# Patient Record
Sex: Female | Born: 1965
Health system: Southern US, Community
[De-identification: ages and names within clinical notes are randomized; demographics above are authoritative.]

## PROBLEM LIST (undated history)

## (undated) DIAGNOSIS — I493 Ventricular premature depolarization: Secondary | ICD-10-CM

## (undated) DIAGNOSIS — D649 Anemia, unspecified: Secondary | ICD-10-CM

## (undated) DIAGNOSIS — K219 Gastro-esophageal reflux disease without esophagitis: Secondary | ICD-10-CM

## (undated) DIAGNOSIS — R519 Headache, unspecified: Secondary | ICD-10-CM

## (undated) DIAGNOSIS — M549 Dorsalgia, unspecified: Secondary | ICD-10-CM

## (undated) DIAGNOSIS — F419 Anxiety disorder, unspecified: Secondary | ICD-10-CM

## (undated) DIAGNOSIS — R51 Headache: Secondary | ICD-10-CM

## (undated) DIAGNOSIS — I471 Supraventricular tachycardia, unspecified: Secondary | ICD-10-CM

## (undated) DIAGNOSIS — E039 Hypothyroidism, unspecified: Secondary | ICD-10-CM

## (undated) DIAGNOSIS — D759 Disease of blood and blood-forming organs, unspecified: Secondary | ICD-10-CM

## (undated) DIAGNOSIS — K5190 Ulcerative colitis, unspecified, without complications: Secondary | ICD-10-CM

## (undated) HISTORY — PX: UPPER GI ENDOSCOPY: SHX6162

## (undated) HISTORY — PX: BACK SURGERY: SHX140

## (undated) HISTORY — PX: COLONOSCOPY: SHX174

## (undated) HISTORY — PX: DILATION AND CURETTAGE OF UTERUS: SHX78

## (undated) HISTORY — PX: RECONSTRUCTION OF NOSE: SHX2301

---

## 2017-01-10 ENCOUNTER — Encounter (HOSPITAL_COMMUNITY): Payer: Self-pay

## 2017-01-10 DIAGNOSIS — N939 Abnormal uterine and vaginal bleeding, unspecified: Secondary | ICD-10-CM | POA: Diagnosis present

## 2017-01-10 DIAGNOSIS — Z79899 Other long term (current) drug therapy: Secondary | ICD-10-CM | POA: Diagnosis not present

## 2017-01-10 NOTE — ED Triage Notes (Signed)
Pt complaining of vaginal bleeding x 8 days. Pt states passing clots. Pt states hx of blood transfusions. Pt states 1 pad/hr.

## 2017-01-11 ENCOUNTER — Emergency Department (HOSPITAL_COMMUNITY)
Admission: EM | Admit: 2017-01-11 | Discharge: 2017-01-11 | Disposition: A | Discharge status: Home / Self Care | Payer: BLUE CROSS/BLUE SHIELD | Attending: Emergency Medicine | Admitting: Emergency Medicine

## 2017-01-11 DIAGNOSIS — N939 Abnormal uterine and vaginal bleeding, unspecified: Secondary | ICD-10-CM

## 2017-01-11 LAB — I-STAT BETA HCG BLOOD, ED (MC, WL, AP ONLY): I-stat hCG, quantitative: <5 m[IU]/mL (ref <5)

## 2017-01-11 LAB — CBC
HEMATOCRIT: 39.2 % (ref 36.0–46.0)
Hemoglobin: 13 g/dL (ref 12.0–15.0)
MCH: 30.4 pg (ref 26.0–34.0)
MCHC: 33.2 g/dL (ref 30.0–36.0)
MCV: 91.6 fL (ref 78.0–100.0)
PLATELETS: 154 10*3/uL (ref 150–400)
RBC: 4.28 MIL/uL (ref 3.87–5.11)
RDW: 13.3 % (ref 11.5–15.5)
WBC: 8.4 10*3/uL (ref 4.0–10.5)

## 2017-01-11 MED ORDER — MEGESTROL ACETATE 20 MG PO TABS: 20.0000 mg | ORAL_TABLET | Freq: Two times a day (BID) | ORAL | 0 refills | Status: DC

## 2017-01-11 NOTE — ED Notes (Signed)
istat HCG resulted at 0101,  Test NEGATIVE and results [facility]

## 2017-01-11 NOTE — ED Notes (Signed)
Pt stable, ambulatory, states understanding of discharge instructions 

## 2017-01-11 NOTE — ED Provider Notes (Signed)
[facility]-EMERGENCY DEPT Provider Note   CSN: [id number] Arrival date & time: 01/10/17  2156  By signing my name below, I, Alexandra Lee, attest that this documentation has been prepared under the direction and in the presence of Alexandra Rhine, MD. Electronically Signed: Elder Lee, Scribe. 01/11/17. 12:31 AM.   History   Chief Complaint Chief Complaint  Patient presents with  . Vaginal Bleeding    HPI Alexandra Lee is a 51 y.o. female with history of blood loss anemia who presents to the ED for evaluation of vaginal bleeding. This patient states that around 9 days ago she initially developed vaginal bleeding associated with her normal menses. However her bleeding has continued at that severity everyday since that time. She presents today concerned for blood loss. She denies any injury. Denies any abdominal pain or vaginal pain. She is not anticoagulated. No fever, vomiting, or syncope.  The history is provided by the patient. No language interpreter was used.  Vaginal Bleeding  Primary symptoms include vaginal bleeding. There has been no fever. This is a new problem. Episode onset: 9 days. The problem occurs hourly. The problem has not changed since onset.The symptoms occur at rest. She is not pregnant. She has not missed her period. Pertinent negatives include no abdominal pain and no vomiting.    PMH - none  OB History    No data available       Home Medications    Prior to Admission medications   Medication Sig Start Date End Date Taking? Authorizing Provider  ferrous sulfate 325 (65 FE) MG tablet Take 325 mg by mouth daily with breakfast.   Yes [provider]  LORazepam (ATIVAN) 1 MG tablet Take 1 mg by mouth at bedtime.   Yes [provider]  mesalamine (CANASA) 1000 MG suppository Place 1,000 mg rectally at bedtime.   Yes [provider]  mesalamine (PENTASA) 500 MG CR capsule Take 1,000 mg by mouth 3 (three) times daily.   Yes  [provider]    Family History No family history on file.  Social History Social History  Substance Use Topics  . Smoking status: Never Smoker  . Smokeless tobacco: Never Used  . Alcohol use No     Allergies   Patient has no known allergies.   Review of Systems Review of Systems  Constitutional: Negative for fever.  Gastrointestinal: Negative for abdominal pain and vomiting.  Genitourinary: Positive for vaginal bleeding. Negative for vaginal pain.  Neurological: Negative for syncope.  All other systems reviewed and are negative.    Physical Exam Updated Vital Signs BP 128/73 (BP Location: Left Arm)   Pulse 91   Temp 98.3 F (36.8 C) (Oral)   Resp 18   LMP 01/10/2017 (Exact Date)   SpO2 100%   Physical Exam CONSTITUTIONAL: Well developed/well nourished HEAD: Normocephalic/atraumatic EYES: EOMI/PERRL ENMT: Mucous membranes moist NECK: supple no meningeal signs SPINE/BACK:entire spine nontender CV: S1/S2 noted, no murmurs/rubs/gallops noted LUNGS: Lungs are clear to auscultation bilaterally, no apparent distress ABDOMEN: soft, nontender, no rebound or guarding, bowel sounds noted throughout abdomen GU:no cva tenderness  , os closed, no active bleeding, blood noted in vaginal vault. Nurse Cricket chaperoned exam NEURO: Pt is awake/alert/appropriate, moves all extremitiesx4.  No facial droop.   EXTREMITIES: pulses normal/equal, full ROM SKIN: warm, color normal PSYCH: no abnormalities of mood noted, alert and oriented to situation   ED Treatments / Results  Labs (all labs ordered are listed, but only abnormal results are displayed) Labs  Reviewed  CBC  I-STAT BETA HCG BLOOD, ED ([facility], [facility], [facility] ONLY)    EKG  EKG Interpretation None       Radiology No results found.  Procedures Procedures (including critical care time)  Medications Ordered in ED Medications - No data to display   Initial Impression / Assessment and Plan / ED Course  I  have reviewed the triage vital signs and the nursing notes.  Pertinent labs results that were available during my care of the patient were reviewed by me and considered in my medical decision making (see chart for details).     Labs reassuring She is not pregnant Pt well appearing  [facility] Heather via phone If patient has significant bleeding or is uncomfortable Megace 20BID can be started Will need close GYN followup  Pt will take the Rx for megace, and if bleeding continues for another 24 hrs she will start meds Multiple referral numbers given to patient to establish Pt appropriate for d/c home    Final Clinical Impressions(s) / ED Diagnoses   Final diagnoses:  Vaginal bleeding    New Prescriptions Discharge Medication List as of 01/11/2017  2:08 AM    START taking these medications   Details  megestrol (MEGACE) 20 MG tablet Take 1 tablet (20 mg total) by mouth 2 (two) times daily., Starting Mon 01/11/2017, Print      I personally performed the services described in this documentation, which was scribed in my presence. The recorded information has been reviewed and is accurate.       Alexandra RhineWickline, Milo Solana, MD 01/11/17 [phone]

## 2017-01-11 NOTE — ED Notes (Signed)
Spoke w/ Alexandra BraunKaren, phlebotomy tech, to inquired on pt HCG. She stated she ran "HCG and it resulted already. It might not be crossing over."

## 2017-02-23 ENCOUNTER — Other Ambulatory Visit: Payer: Self-pay | Admitting: Obstetrics and Gynecology

## 2017-02-23 ENCOUNTER — Encounter (HOSPITAL_COMMUNITY): Payer: Self-pay | Admitting: *Deleted

## 2017-02-24 ENCOUNTER — Encounter (HOSPITAL_COMMUNITY): Payer: Self-pay | Admitting: *Deleted

## 2017-02-26 ENCOUNTER — Encounter (HOSPITAL_COMMUNITY)
Admission: RE | Disposition: A | Discharge status: Home / Self Care | Payer: Self-pay | Source: Ambulatory Visit | Attending: Obstetrics and Gynecology

## 2017-02-26 ENCOUNTER — Ambulatory Visit (HOSPITAL_COMMUNITY)
Admission: RE | Admit: 2017-02-26 | Discharge: 2017-02-26 | Disposition: A | Discharge status: Home / Self Care | Payer: BLUE CROSS/BLUE SHIELD | Source: Ambulatory Visit | Attending: Obstetrics and Gynecology | Admitting: Obstetrics and Gynecology

## 2017-02-26 ENCOUNTER — Ambulatory Visit (HOSPITAL_COMMUNITY): Payer: BLUE CROSS/BLUE SHIELD | Admitting: Anesthesiology

## 2017-02-26 ENCOUNTER — Encounter (HOSPITAL_COMMUNITY): Payer: Self-pay | Admitting: Anesthesiology

## 2017-02-26 DIAGNOSIS — Z91040 Latex allergy status: Secondary | ICD-10-CM | POA: Diagnosis not present

## 2017-02-26 DIAGNOSIS — E039 Hypothyroidism, unspecified: Secondary | ICD-10-CM | POA: Insufficient documentation

## 2017-02-26 DIAGNOSIS — F419 Anxiety disorder, unspecified: Secondary | ICD-10-CM | POA: Insufficient documentation

## 2017-02-26 DIAGNOSIS — N939 Abnormal uterine and vaginal bleeding, unspecified: Secondary | ICD-10-CM | POA: Diagnosis present

## 2017-02-26 DIAGNOSIS — K219 Gastro-esophageal reflux disease without esophagitis: Secondary | ICD-10-CM | POA: Diagnosis not present

## 2017-02-26 DIAGNOSIS — Z886 Allergy status to analgesic agent status: Secondary | ICD-10-CM | POA: Insufficient documentation

## 2017-02-26 DIAGNOSIS — N840 Polyp of corpus uteri: Secondary | ICD-10-CM | POA: Insufficient documentation

## 2017-02-26 HISTORY — DX: Hypothyroidism, unspecified: E03.9

## 2017-02-26 HISTORY — DX: Ulcerative colitis, unspecified, without complications: K51.90

## 2017-02-26 HISTORY — DX: Anemia, unspecified: D64.9

## 2017-02-26 HISTORY — DX: Headache: R51

## 2017-02-26 HISTORY — PX: DILATATION & CURETTAGE/HYSTEROSCOPY WITH MYOSURE: SHX6511

## 2017-02-26 HISTORY — DX: Gastro-esophageal reflux disease without esophagitis: K21.9

## 2017-02-26 HISTORY — DX: Disease of blood and blood-forming organs, unspecified: D75.9

## 2017-02-26 HISTORY — DX: Ventricular premature depolarization: I49.3

## 2017-02-26 HISTORY — DX: Anxiety disorder, unspecified: F41.9

## 2017-02-26 HISTORY — DX: Headache, unspecified: R51.9

## 2017-02-26 LAB — BASIC METABOLIC PANEL
ANION GAP: 10 (ref 5–15)
BUN: 14 mg/dL (ref 6–20)
CHLORIDE: 103 mmol/L (ref 101–111)
CO2: 23 mmol/L (ref 22–32)
Calcium: 9.2 mg/dL (ref 8.9–10.3)
Creatinine, Ser: 0.64 mg/dL (ref 0.44–1.00)
GFR calc non Af Amer: >60 mL/min (ref >60)
Glucose, Bld: 100 mg/dL — ABNORMAL HIGH (ref 65–99)
POTASSIUM: 3.5 mmol/L (ref 3.5–5.1)
Sodium: 136 mmol/L (ref 135–145)

## 2017-02-26 LAB — CBC
HEMATOCRIT: 41.3 % (ref 36.0–46.0)
HEMOGLOBIN: 13.3 g/dL (ref 12.0–15.0)
MCH: 30.2 pg (ref 26.0–34.0)
MCHC: 32.2 g/dL (ref 30.0–36.0)
MCV: 93.7 fL (ref 78.0–100.0)
Platelets: 361 10*3/uL (ref 150–400)
RBC: 4.41 MIL/uL (ref 3.87–5.11)
RDW: 14 % (ref 11.5–15.5)
WBC: 8 10*3/uL (ref 4.0–10.5)

## 2017-02-26 SURGERY — DILATATION & CURETTAGE/HYSTEROSCOPY WITH MYOSURE
Anesthesia: General | Site: Vagina

## 2017-02-26 MED ORDER — SODIUM CHLORIDE 0.9 % IJ SOLN
INTRAMUSCULAR | Status: DC | PRN
  Administered 2017-02-26: 20 mL

## 2017-02-26 MED ORDER — ONDANSETRON HCL 4 MG/2ML IJ SOLN: 4.0000 mg | Freq: Once | INTRAMUSCULAR | Status: DC | PRN

## 2017-02-26 MED ORDER — ACETAMINOPHEN 160 MG/5ML PO SOLN: 325.0000 mg | ORAL | Status: DC | PRN

## 2017-02-26 MED ORDER — SCOPOLAMINE 1 MG/3DAYS TD PT72
1.0000 | MEDICATED_PATCH | Freq: Once | TRANSDERMAL | Status: DC
  Administered 2017-02-26: 1.5 mg via TRANSDERMAL

## 2017-02-26 MED ORDER — ACETAMINOPHEN 325 MG PO TABS: 325.0000 mg | ORAL_TABLET | ORAL | Status: DC | PRN

## 2017-02-26 MED ORDER — MIDAZOLAM HCL 2 MG/2ML IJ SOLN
INTRAMUSCULAR | Status: AC
Start: 2017-02-26 — End: ?
  Filled 2017-02-26: qty 2

## 2017-02-26 MED ORDER — LACTATED RINGERS IV SOLN
INTRAVENOUS | Status: DC
  Administered 2017-02-26: 08:00:00 via INTRAVENOUS

## 2017-02-26 MED ORDER — TRAMADOL HCL 50 MG PO TABS: 50.0000 mg | ORAL_TABLET | Freq: Four times a day (QID) | ORAL | 0 refills | Status: DC | PRN

## 2017-02-26 MED ORDER — DEXAMETHASONE SODIUM PHOSPHATE 10 MG/ML IJ SOLN
INTRAMUSCULAR | Status: DC | PRN
  Administered 2017-02-26: 10 mg via INTRAVENOUS

## 2017-02-26 MED ORDER — PROPOFOL 10 MG/ML IV BOLUS
INTRAVENOUS | Status: DC | PRN
  Administered 2017-02-26: 150 mg via INTRAVENOUS

## 2017-02-26 MED ORDER — FENTANYL CITRATE (PF) 100 MCG/2ML IJ SOLN
INTRAMUSCULAR | Status: DC | PRN
  Administered 2017-02-26 (×2): 50 ug via INTRAVENOUS

## 2017-02-26 MED ORDER — SCOPOLAMINE 1 MG/3DAYS TD PT72
MEDICATED_PATCH | TRANSDERMAL | Status: AC
  Administered 2017-02-26: 1.5 mg via TRANSDERMAL
  Filled 2017-02-26: qty 1

## 2017-02-26 MED ORDER — ONDANSETRON HCL 4 MG/2ML IJ SOLN
INTRAMUSCULAR | Status: DC | PRN
  Administered 2017-02-26: 4 mg via INTRAVENOUS

## 2017-02-26 MED ORDER — ONDANSETRON HCL 4 MG/2ML IJ SOLN
INTRAMUSCULAR | Status: AC
  Filled 2017-02-26: qty 2

## 2017-02-26 MED ORDER — OXYCODONE HCL 5 MG PO TABS: 5.0000 mg | ORAL_TABLET | Freq: Once | ORAL | Status: DC | PRN

## 2017-02-26 MED ORDER — OXYCODONE HCL 5 MG/5ML PO SOLN: 5.0000 mg | Freq: Once | ORAL | Status: DC | PRN

## 2017-02-26 MED ORDER — BUPIVACAINE HCL (PF) 0.25 % IJ SOLN
INTRAMUSCULAR | Status: AC
  Filled 2017-02-26: qty 30

## 2017-02-26 MED ORDER — KETOROLAC TROMETHAMINE 30 MG/ML IJ SOLN
INTRAMUSCULAR | Status: DC | PRN
  Administered 2017-02-26: 30 mg via INTRAVENOUS

## 2017-02-26 MED ORDER — DEXAMETHASONE SODIUM PHOSPHATE 10 MG/ML IJ SOLN
INTRAMUSCULAR | Status: AC
  Filled 2017-02-26: qty 1

## 2017-02-26 MED ORDER — FENTANYL CITRATE (PF) 100 MCG/2ML IJ SOLN
INTRAMUSCULAR | Status: AC
  Filled 2017-02-26: qty 2

## 2017-02-26 MED ORDER — VASOPRESSIN 20 UNIT/ML IV SOLN
INTRAVENOUS | Status: DC | PRN
  Administered 2017-02-26: 20 [IU]

## 2017-02-26 MED ORDER — CEFAZOLIN SODIUM-DEXTROSE 2-4 GM/100ML-% IV SOLN
2.0000 g | INTRAVENOUS | Status: AC
  Administered 2017-02-26: 2 g via INTRAVENOUS

## 2017-02-26 MED ORDER — FENTANYL CITRATE (PF) 100 MCG/2ML IJ SOLN: 25.0000 ug | INTRAMUSCULAR | Status: DC | PRN

## 2017-02-26 MED ORDER — LIDOCAINE HCL (CARDIAC) 20 MG/ML IV SOLN
INTRAVENOUS | Status: AC
  Filled 2017-02-26: qty 5

## 2017-02-26 MED ORDER — MIDAZOLAM HCL 5 MG/5ML IJ SOLN
INTRAMUSCULAR | Status: DC | PRN
  Administered 2017-02-26: 2 mg via INTRAVENOUS

## 2017-02-26 MED ORDER — PROPOFOL 10 MG/ML IV BOLUS
INTRAVENOUS | Status: AC
  Filled 2017-02-26: qty 20

## 2017-02-26 MED ORDER — LIDOCAINE HCL (CARDIAC) 20 MG/ML IV SOLN
INTRAVENOUS | Status: DC | PRN
  Administered 2017-02-26: 50 mg via INTRAVENOUS

## 2017-02-26 MED ORDER — MEPERIDINE HCL 25 MG/ML IJ SOLN: 6.2500 mg | INTRAMUSCULAR | Status: DC | PRN

## 2017-02-26 MED ORDER — BUPIVACAINE HCL (PF) 0.25 % IJ SOLN
INTRAMUSCULAR | Status: DC | PRN
  Administered 2017-02-26: 20 mL

## 2017-02-26 SURGICAL SUPPLY — 21 items
CANISTER SUCT 3000ML PPV (MISCELLANEOUS) ×2 IMPLANT
CATH ROBINSON RED A/P 16FR (CATHETERS) ×2 IMPLANT
CLOTH BEACON ORANGE TIMEOUT ST (SAFETY) ×2 IMPLANT
CONTAINER PREFILL 10% NBF 60ML (FORM) ×4 IMPLANT
DECANTER SPIKE VIAL GLASS SM (MISCELLANEOUS) ×4 IMPLANT
DEVICE MYOSURE LITE (MISCELLANEOUS) IMPLANT
DEVICE MYOSURE REACH (MISCELLANEOUS) ×2 IMPLANT
FILTER ARTHROSCOPY CONVERTOR (FILTER) ×2 IMPLANT
GLOVE BIO SURGEON STRL SZ7.5 (GLOVE) ×2 IMPLANT
GLOVE BIOGEL PI IND STRL 7.0 (GLOVE) ×1 IMPLANT
GLOVE BIOGEL PI INDICATOR 7.0 (GLOVE) ×1
GOWN STRL REUS W/TWL LRG LVL3 (GOWN DISPOSABLE) ×4 IMPLANT
NEEDLE SPNL 22GX3.5 QUINCKE BK (NEEDLE) ×4 IMPLANT
PACK VAGINAL MINOR WOMEN LF (CUSTOM PROCEDURE TRAY) ×2 IMPLANT
PAD OB MATERNITY 4.3X12.25 (PERSONAL CARE ITEMS) ×2 IMPLANT
SEAL ROD LENS SCOPE MYOSURE (ABLATOR) ×2 IMPLANT
SYR CONTROL 10ML LL (SYRINGE) ×4 IMPLANT
SYR TB 1ML 25GX5/8 (SYRINGE) ×2 IMPLANT
TOWEL OR 17X24 6PK STRL BLUE (TOWEL DISPOSABLE) ×4 IMPLANT
TUBING AQUILEX INFLOW (TUBING) ×2 IMPLANT
TUBING AQUILEX OUTFLOW (TUBING) ×2 IMPLANT

## 2017-02-26 NOTE — Anesthesia Preprocedure Evaluation (Signed)
Anesthesia Evaluation  Patient identified by MRN, date of birth, ID band Patient awake    Reviewed: Allergy & Precautions, H&P , NPO status , Patient's Chart, lab work & pertinent test results  Airway Mallampati: I  TM Distance: >3 FB Neck ROM: full    Dental no notable dental hx. (+) Teeth Intact   Pulmonary neg pulmonary ROS,    breath sounds clear to auscultation       Cardiovascular negative cardio ROS Normal cardiovascular exam Rhythm:regular Rate:Normal     Neuro/Psych PSYCHIATRIC DISORDERS Anxiety    GI/Hepatic Neg liver ROS,   Endo/Other  Hypothyroidism   Renal/GU negative Renal ROS     Musculoskeletal   Abdominal Normal abdominal exam  (+)   Peds  Hematology   Anesthesia Other Findings   Reproductive/Obstetrics negative OB ROS                             Anesthesia Physical Anesthesia Plan  ASA: II  Anesthesia Plan: General   Post-op Pain Management:    Induction: Intravenous  PONV Risk Score and Plan: 4 or greater and Ondansetron, Dexamethasone, Propofol, Midazolam and Scopolamine patch - Pre-op  Airway Management Planned: LMA  Additional Equipment:   Intra-op Plan:   Post-operative Plan:   Informed Consent: I have reviewed the patients History and Physical, chart, labs and discussed the procedure including the risks, benefits and alternatives for the proposed anesthesia with the patient or authorized representative who has indicated his/her understanding and acceptance.   Dental Advisory Given  Plan Discussed with: CRNA and Surgeon  Anesthesia Plan Comments:         Anesthesia Quick Evaluation

## 2017-02-26 NOTE — H&P (Signed)
Alexandra Lee is an 51 y.o. female. AUB with secondary anemia  Pertinent Gynecological History: Menses: flow is moderate Bleeding: intermenstrual bleeding Contraception: tubal ligation DES exposure: denies Blood transfusions: none Sexually transmitted diseases: no past history Previous GYN Procedures: DNC  Last mammogram: normal Date: 2018 Last pap: normal Date: 2018 OB History: G2, P2   Menstrual History: Menarche age: [age] No LMP recorded. Patient is premenopausal.    Past Medical History:  Diagnosis Date  . Anemia   . Anxiety   . Blood dyscrasia   . GERD (gastroesophageal reflux disease)    tums after certain foods  . Headache   . Hypothyroidism   . PVC (premature ventricular contraction) no meds    increase pulse occasionally   . UC (ulcerative colitis) [facility](HCC)     Past Surgical History:  Procedure Laterality Date  . CESAREAN SECTION     x 2  . DILATION AND CURETTAGE OF UTERUS      History reviewed. No pertinent family history.  Social History:  reports that she has never smoked. She has never used smokeless tobacco. She reports that she does not drink alcohol or use drugs.  Allergies:  Allergies  Allergen Reactions  . Doxycycline Diarrhea and Nausea And Vomiting  . Ibuprofen Other (See Comments)    interacts with ulcerative colitis medications.  . Latex Other (See Comments)    Skin reaction with prolonged contact (i.e. Bandages/sutures)    No prescriptions prior to admission.    Review of Systems  Constitutional: Negative.   All other systems reviewed and are negative.   Height 5\' 5"  (1.651 m), weight 68 kg (150 lb). Physical Exam  Nursing note and vitals reviewed. Constitutional: She is oriented to person, place, and time. She appears well-developed and well-nourished.  HENT:  Head: Atraumatic.  Neck: Normal range of motion. Neck supple.  Cardiovascular: Normal rate and regular rhythm.   Respiratory: Effort normal and breath sounds normal.   GI: Soft. Bowel sounds are normal.  Genitourinary: Vagina normal and uterus normal.  Musculoskeletal: Normal range of motion.  Neurological: She is alert and oriented to person, place, and time. She has normal reflexes.  Skin: Skin is warm and dry.  Psychiatric: She has a normal mood and affect.    No results found for this or any previous visit (from the past 24 hour(s)).  No results found.  Assessment/Plan: AUB with large polpoid lesion DIag HS, D&C with myosure Risks vs benefits discussed. Consent done.  Adin Lariccia J 02/26/2017, 6:19 AM

## 2017-02-26 NOTE — Progress Notes (Signed)
Patient seen and examined. Consent witnessed and signed. No changes noted. Update completed.Patient ID: Alexandra ForthStephanie Lee, female   DOB: 10-24-1965, 51 y.o.   MRN: [id number]

## 2017-02-26 NOTE — Transfer of Care (Signed)
Immediate Anesthesia Transfer of Care Note  Patient: Alexandra Lee  Procedure(s) Performed: Procedure(s): DILATATION & CURETTAGE/HYSTEROSCOPY WITH MYOSURE (N/A)  Patient Location: PACU  Anesthesia Type:General  Level of Consciousness: awake  Airway & Oxygen Therapy: Patient Spontanous Breathing and Patient connected to nasal cannula oxygen  Post-op Assessment: Report given to RN and Post -op Vital signs reviewed and stable  Post vital signs: stable  Last Vitals:  Vitals:   02/26/17 0804  BP: 114/75  Pulse: 99  Resp: 18  Temp: 37.5 C    Last Pain:  Vitals:   02/26/17 0804  TempSrc: Oral  PainSc: 3       Patients Stated Pain Goal: 3 (02/26/17 0804)  Complications: No apparent anesthesia complications

## 2017-02-26 NOTE — Op Note (Signed)
NAMReeves Forth:  Alexandra Lee, Alexandra Lee              ACCOUNT NO.:  [id number]  MEDICAL RECORD NO.:  [id number]  LOCATION:                                 FACILITY:  PHYSICIAN:  Lenoard Adenichard J. Ulmer Degen, M.D.DATE OF BIRTH:  Aug 17, 1966  DATE OF PROCEDURE: DATE OF DISCHARGE:                              OPERATIVE REPORT   PREOPERATIVE DIAGNOSIS:  Abnormal uterine bleeding with large endometrial polyp.  POSTOPERATIVE DIAGNOSIS:  Abnormal uterine bleeding with large endometrial polyp.  PROCEDURE:  Diagnostic hysteroscopy, D and C, MyoSure resection of large endometrial polyp.  SURGEON:  Lenoard Adenichard J. Belissa Kooy, M.D.  ASSISTANT:  None.  ANESTHESIA:  General and local.  ESTIMATED BLOOD LOSS:  Less than 50 mL.  FLUID DEFICIT:  400 mL.  COMPLICATIONS:  None.  DRAINS:  None.  COUNTS:  Correct.  DISPOSITION:  The patient to Recovery in good condition.  SPECIMEN:  To pathology.  DESCRIPTION OF PROCEDURE:  After being apprised of risks of anesthesia, infection, bleeding, injury to surrounding organs, possible need for repair, delayed versus immediate complications to include bowel and bladder injury, and possible need for repair, the patient was brought to the operating room.  She was administered general anesthetic without complications.  Prepped and draped in usual sterile fashion, catheterized until bladder was empty.  Exam under anesthesia reveals a bulky irregular anteflexed uterus and no adnexal masses at this time. Dilute Pitressin solution placed at 3 and 9 o'clock, 20 mL total. Dilute Marcaine solution was placed in standard paracervical block. Cervix then easily dilated up to a #25 Pratt dilator hysteroscope placed.  Visualization limited by an exceedingly large endometrial polyp occupying the entire endometrial cavity for which distention was hard to provide visualization around the edges of the polyp, therefore MyoSure device was entered and attempts were begun resecting the midportion of the  polyp so as to decompress it.  These efforts were successful afterwards as the resection proceeded.  The endometrial polyp margins were better identified and completely resected.  At this time, after removal and normal endometrial atrophic-appearing endometrial cavity was noted with bilateral normal tubal ostia.  D and C was also performed.  All instruments were removed.  Minimal bleeding noted. Fluid deficit was noted.  The patient tolerated the procedure well, awakened and transferred to Recovery in good condition.     Lenoard Adenichard J. Jylian Pappalardo, M.D.     RJT/MEDQ  D:  02/26/2017  T:  02/26/2017  Job:  [id number]

## 2017-02-26 NOTE — Op Note (Signed)
02/26/2017  9:57 AM  PATIENT:  Alexandra Lee  51 y.o. female  PRE-OPERATIVE DIAGNOSIS:  Abnormal Uterine Bleeding  POST-OPERATIVE DIAGNOSIS:  Abnormal Uterine Bleeding Large endometrial polyp  PROCEDURE:  Procedure(s): DILATATION & CURETTAGE HYSTEROSCOPY WITH MYOSURE RESECTION OF LARGE ENDOMETRIAL POLYP  SURGEON:  Surgeon(s): Olivia Mackieaavon, Danyah Guastella, MD  ASSISTANTS: none   ANESTHESIA:   local and general  ESTIMATED BLOOD LOSS: MINIMAL  DRAINS: none   LOCAL MEDICATIONS USED:  MARCAINE    and Amount: 25 ml  SPECIMEN:  [facility] Specimen[facility]  DISPOSITION OF SPECIMEN: PATHOLOGY   Source of Specimen:  [facility] AND LARGE POLYP  COUNTS:  YES  DICTATION #: [id number]  PLAN OF CARE: DC HOME  PATIENT DISPOSITION:  PACU - hemodynamically stable.

## 2017-02-26 NOTE — Anesthesia Procedure Notes (Signed)
Procedure Name: LMA Insertion Date/Time: 02/26/2017 9:12 AM Performed by: Fanny DanceMULLINS, Shaylee Stanislawski L Pre-anesthesia Checklist: Patient identified, Patient being monitored, Emergency Drugs available, Timeout performed and Suction available Patient Re-evaluated:Patient Re-evaluated prior to inductionOxygen Delivery Method: Circle System Utilized Preoxygenation: Pre-oxygenation with 100% oxygen Intubation Type: IV induction Ventilation: Mask ventilation without difficulty LMA: LMA inserted LMA Size: 4.0 Number of attempts: 1 Placement Confirmation: positive ETCO2 and breath sounds checked- equal and bilateral Dental Injury: Teeth and Oropharynx as per pre-operative assessment

## 2017-02-26 NOTE — Discharge Instructions (Signed)

## 2017-02-27 NOTE — Anesthesia Postprocedure Evaluation (Signed)
Anesthesia Post Note  Patient: Alexandra Lee  Procedure(s) Performed: Procedure(s) (LRB): DILATATION & CURETTAGE/HYSTEROSCOPY WITH MYOSURE (N/A)     Patient location during evaluation: PACU Anesthesia Type: General Level of consciousness: awake Pain management: pain level controlled Vital Signs Assessment: post-procedure vital signs reviewed and stable Respiratory status: spontaneous breathing Cardiovascular status: stable Postop Assessment: no signs of nausea or vomiting Anesthetic complications: no    Last Vitals:  Vitals:   02/26/17 1123 02/26/17 1201  BP:  108/68  Pulse:  88  Resp:  18  Temp: 36.8 C     Last Pain:  Vitals:   02/26/17 1123  TempSrc: Oral  PainSc:    Pain Goal: Patients Stated Pain Goal: 3 (02/26/17 0804)               Daltyn Degroat JR,JOHN Susann GivensFRANKLIN

## 2017-02-28 ENCOUNTER — Encounter (HOSPITAL_COMMUNITY): Payer: Self-pay | Admitting: Obstetrics and Gynecology

## 2017-11-29 NOTE — Progress Notes (Signed)
Cardiology Office Note   Date:  11/29/2017   ID:  Alexandra ForthStephanie Lee, DOB 03/23/1966, MRN [id number]  PCP:  Georgann HousekeeperHusain, Karrar, MD  Cardiologist:   No primary care provider on file. Referring:  Georgann HousekeeperHusain, Karrar, MD  No chief complaint on file.     History of Present Illness: Alexandra Lee is a 52 y.o. female who presents for evaluation of palpitations.  [place]  For the past 3 years she has been seeing a cardiologist and I do not yet have these records.  She was being managed for dizziness and was told she had PVCs and sinus tachycardia.  She remembers having an echocardiogram.  There was talk of doing a stress test because she would get some chest fullness.  This was never done.  She was treated symptomatically with metoprolol but got hypotensive with this.  She is tried to use this as needed and it seems to help a little bit.  She feels her heart racing.  It seems to happen if she thinks about it with emotional stress.  She is raising two young sons with autism.  She has not had any presyncope or syncope.  She has no PND or orthopnea.  Past Medical History:  Diagnosis Date  . Anemia   . Anxiety   . Blood dyscrasia   . GERD (gastroesophageal reflux disease)    tums after certain foods  . Headache   . Hypothyroidism   . PVC (premature ventricular contraction) no meds    increase pulse occasionally   . UC (ulcerative colitis) [facility](HCC)     Past Surgical History:  Procedure Laterality Date  . CESAREAN SECTION     x 2  . DILATATION & CURETTAGE/HYSTEROSCOPY WITH MYOSURE N/A 02/26/2017   Procedure: DILATATION & CURETTAGE/HYSTEROSCOPY WITH MYOSURE;  Surgeon: Olivia Mackieaavon, Richard, MD;  Location: [facility];  Service: Gynecology;  Laterality: N/A;  . DILATION AND CURETTAGE OF UTERUS       Current Outpatient Medications  Medication Sig Dispense Refill  . acetaminophen (TYLENOL) 325 MG tablet Take 650 mg by mouth every 6 (six) hours as needed (for pain.).    Marland Kitchen.  Ferrous Fumarate 29 MG TABS Take 29 mg by mouth daily before supper. (1700) One (1) hour before supper.    . levothyroxine (SYNTHROID, LEVOTHROID) 50 MCG tablet Take 50 mcg by mouth daily before breakfast.  0  . LORazepam (ATIVAN) 1 MG tablet Take 1 mg by mouth at bedtime.    . mesalamine (CANASA) 1000 MG suppository Place 1,000 mg rectally at bedtime.    . mesalamine (PENTASA) 500 MG CR capsule Take 1,000 mg by mouth 3 (three) times daily.    . traMADol (ULTRAM) 50 MG tablet Take 1-2 tablets (50-100 mg total) by mouth every 6 (six) hours as needed. 30 tablet 0   No current facility-administered medications for this visit.     Allergies:   Doxycycline; Ibuprofen; and Latex    Social History:  The patient  reports that she has never smoked. She has never used smokeless tobacco. She reports that she does not drink alcohol or use drugs.   Family History:  The patient's family history is not on file.    ROS:  Please see the history of present illness.   Otherwise, review of systems are positive for none.   All other systems are reviewed and negative.    PHYSICAL EXAM: VS:  There were no vitals taken for this visit. , BMI There  is no height or weight on file to calculate BMI. GENERAL:  Well appearing HEENT:  Pupils equal round and reactive, fundi not visualized, oral mucosa unremarkable NECK:  No jugular venous distention, waveform within normal limits, carotid upstroke brisk and symmetric, no bruits, no thyromegaly LYMPHATICS:  No cervical, inguinal adenopathy LUNGS:  Clear to auscultation bilaterally BACK:  No CVA tenderness CHEST:  Unremarkable HEART:  PMI not displaced or sustained,S1 and S2 within normal limits, no S3, no S4, no clicks, no rubs, no murmurs ABD:  Flat, positive bowel sounds normal in frequency in pitch, no bruits, no rebound, no guarding, no midline pulsatile mass, no hepatomegaly, no splenomegaly EXT:  2 plus pulses throughout, no edema, no cyanosis no clubbing SKIN:   No rashes no nodules NEURO:  Cranial nerves II through XII grossly intact, motor grossly intact throughout PSYCH:  Cognitively intact, oriented to person place and time    EKG:  EKG is ordered today. The ekg ordered today demonstrates sinus rhythm, rate 100, axis within normal limits, intervals within normal limits, no acute ST-T wave changes.   Recent Labs: 02/26/2017: BUN 14; Creatinine, Ser 0.64; Hemoglobin 13.3; Platelets 361; Potassium 3.5; Sodium 136    Lipid Panel No results found for: CHOL, TRIG, HDL, CHOLHDL, VLDL, LDLCALC, LDLDIRECT    Wt Readings from Last 3 Encounters:  02/24/17 150 lb (68 kg)      Other studies Reviewed: Additional studies/ records that were reviewed today include: Labs. Review of the above records demonstrates:  Please see elsewhere in the note.     ASSESSMENT AND PLAN:  PALPITATIONS: I do not yet have the records from her previous cardiologist but it sounds like she has sinus tachycardia.  I do have some labs to include a normal TSH and electrolytes.  I suspect much of this is related to the significant stress that she is under.  We had a long discussion about this and how to manage that.  I will give her PRN propranolol.  I think the most important thing is for her to find support help with her stress and anxiety.  Part of this should be an exercise plan and she is cleared to start using her stationary bike.  If she does not have improvement in her symptoms after she has been doing this routinely I would consider further evaluation.  CHEST PAIN:  She has some vague symptoms.  Again I would consider stress testing only if she has more symptoms after she is worked on her stress and fitness level.   Current medicines are reviewed at length with the patient today.  The patient does not have concerns regarding medicines.  The following changes have been made:  As above.   Labs/ tests ordered today include:  No orders of the defined types were placed  in this encounter.    Disposition:   FU with me in six months.     Signed, Rollene Rotunda, MD  11/29/2017 9:02 PM    [facility]

## 2017-12-01 ENCOUNTER — Ambulatory Visit: Payer: BLUE CROSS/BLUE SHIELD | Admitting: Cardiology

## 2017-12-01 ENCOUNTER — Encounter: Payer: Self-pay | Admitting: Cardiology

## 2017-12-01 VITALS — BP 116/72 | HR 100 | Ht 65.5 in | Wt 172.0 lb

## 2017-12-01 DIAGNOSIS — R002 Palpitations: Secondary | ICD-10-CM | POA: Diagnosis not present

## 2017-12-01 DIAGNOSIS — I493 Ventricular premature depolarization: Secondary | ICD-10-CM

## 2017-12-01 MED ORDER — PROPRANOLOL HCL 10 MG PO TABS: 10.0000 mg | ORAL_TABLET | Freq: Three times a day (TID) | ORAL | 3 refills | Status: DC | PRN

## 2017-12-01 NOTE — Patient Instructions (Signed)
Dr Antoine PocheHochrein has recommended making the following medication changes: 1. START Propranolol 10 mg - take 3 times a day as needed  Your physician recommends that you schedule a follow-up appointment in 4 months.  If you need a refill on your cardiac medications before your next appointment, please call your pharmacy.

## 2017-12-17 ENCOUNTER — Telehealth: Payer: Self-pay | Admitting: Cardiology

## 2017-12-17 NOTE — Telephone Encounter (Signed)
Received incoming records from Dr. Barbee ShropshireJai [facility] for upcoming appointment on 04/12/18 @ 3pm with Dr. Antoine PocheHochrein. [facility] Records. 12/17/17 ab

## 2017-12-29 ENCOUNTER — Ambulatory Visit
Admission: RE | Admit: 2017-12-29 | Discharge: 2017-12-29 | Disposition: A | Discharge status: Home / Self Care | Payer: BLUE CROSS/BLUE SHIELD | Source: Ambulatory Visit | Attending: Internal Medicine | Admitting: Internal Medicine

## 2017-12-29 ENCOUNTER — Other Ambulatory Visit: Payer: Self-pay | Admitting: Internal Medicine

## 2017-12-29 DIAGNOSIS — M542 Cervicalgia: Secondary | ICD-10-CM

## 2017-12-29 IMAGING — CR DG CERVICAL SPINE 2 OR 3 VIEWS
3 series · 3 of 3 positions shown · non-contrast
Comparison: None.

CLINICAL DATA: Neck and bilateral arm pain and tingling, chronic.

EXAM:
CERVICAL SPINE - 2-3 VIEW

[w c-spine lat]
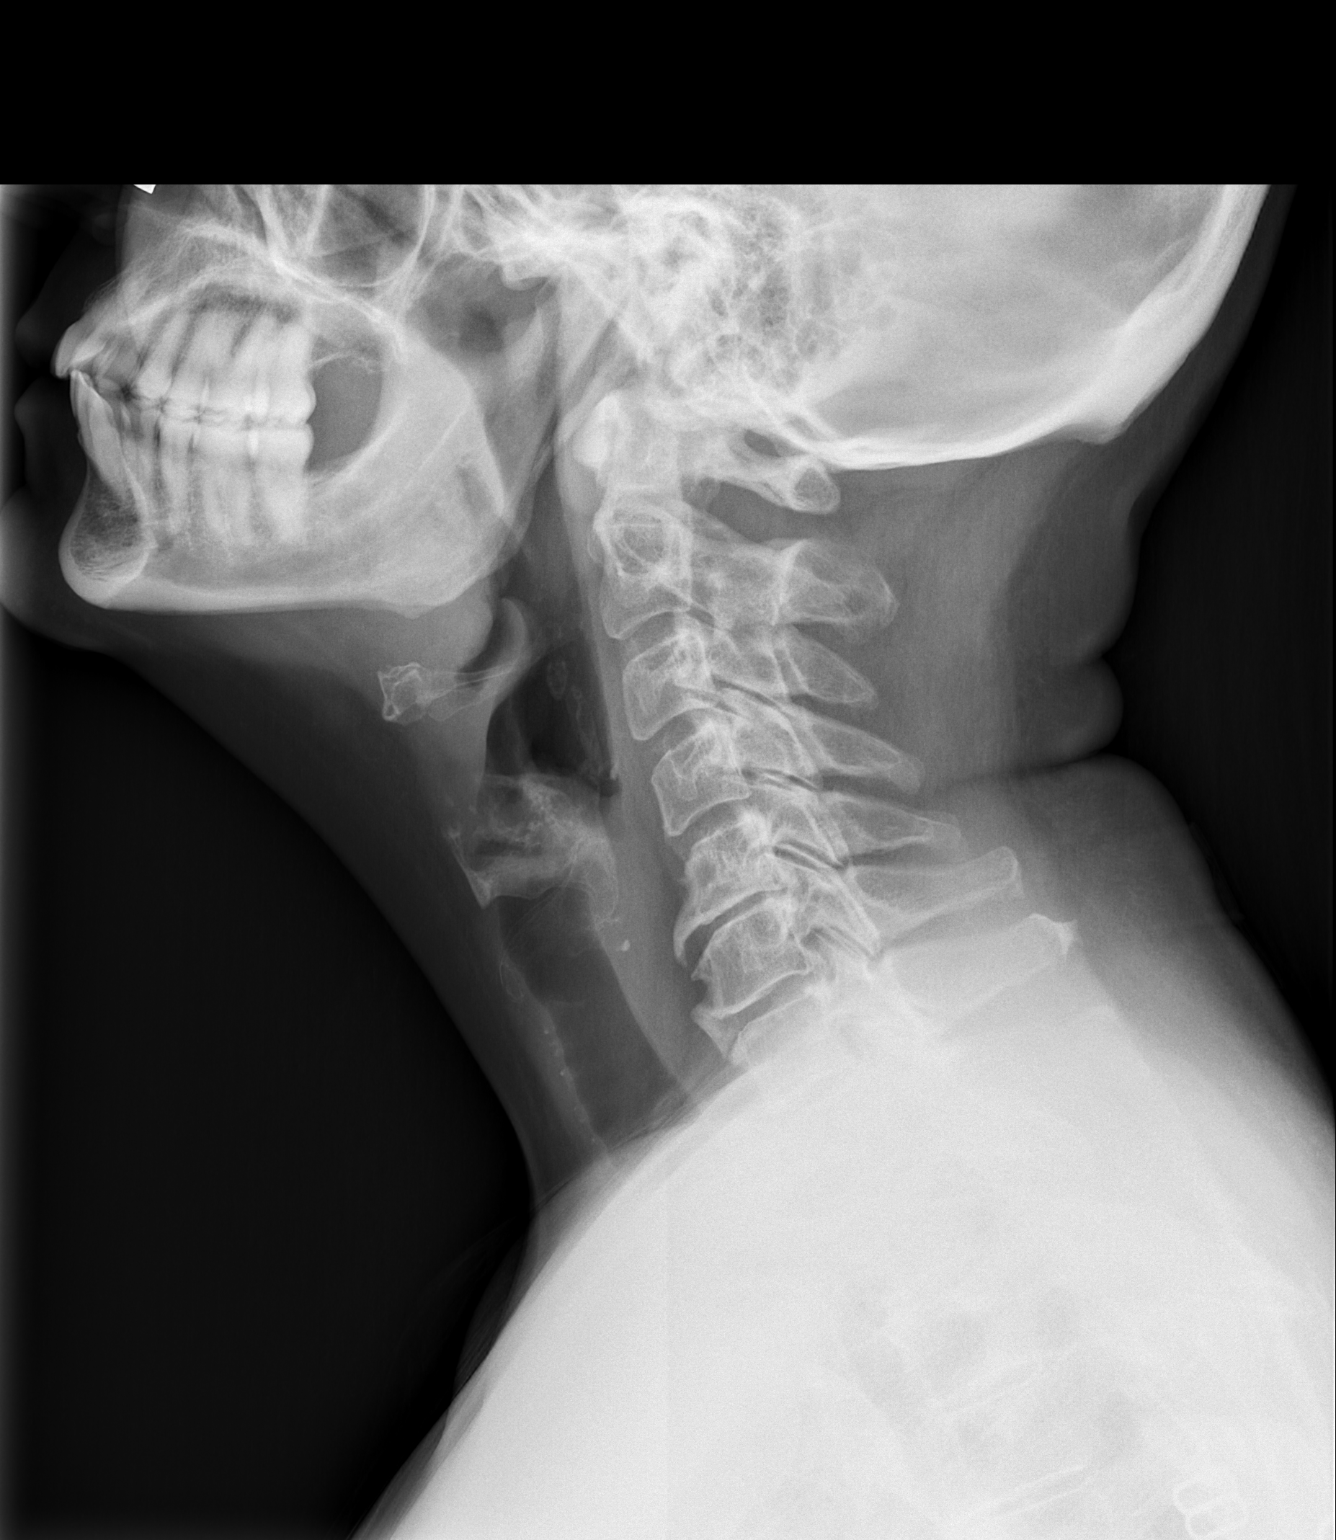

[w c-spine a.p. *]
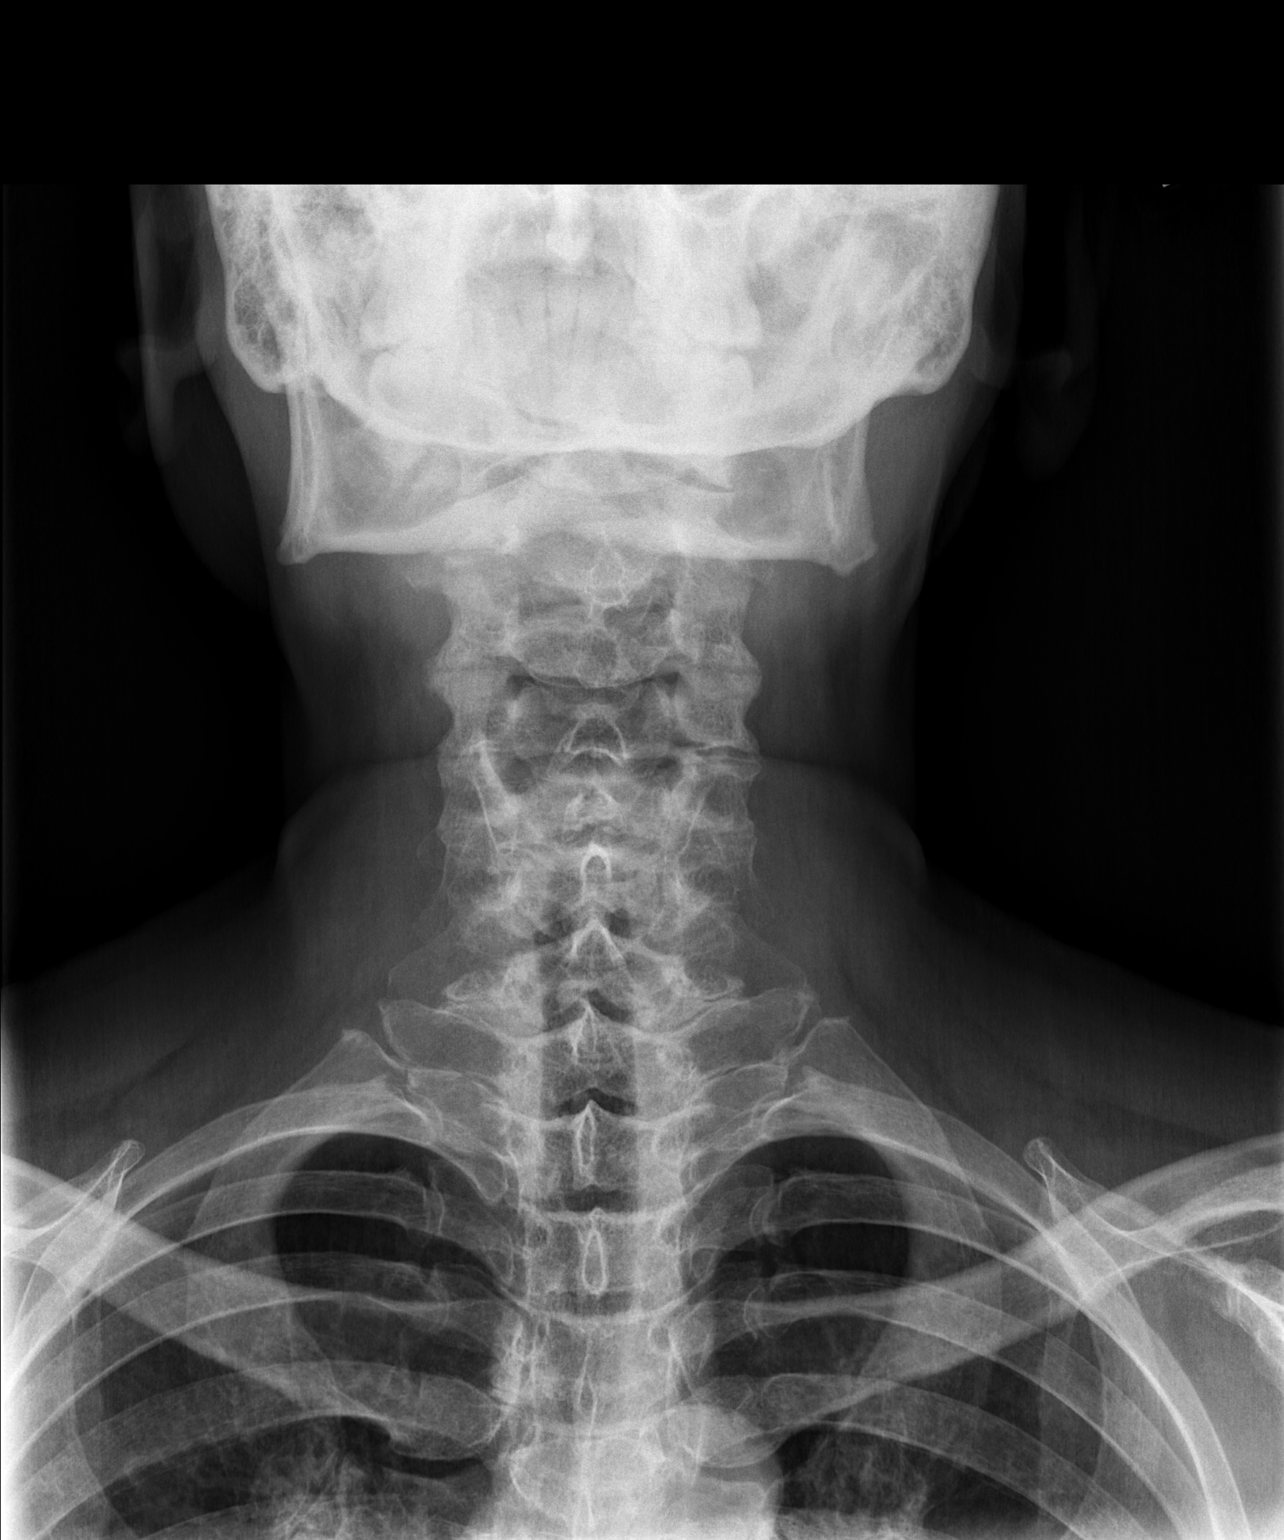

[w c-spine odontoid *]
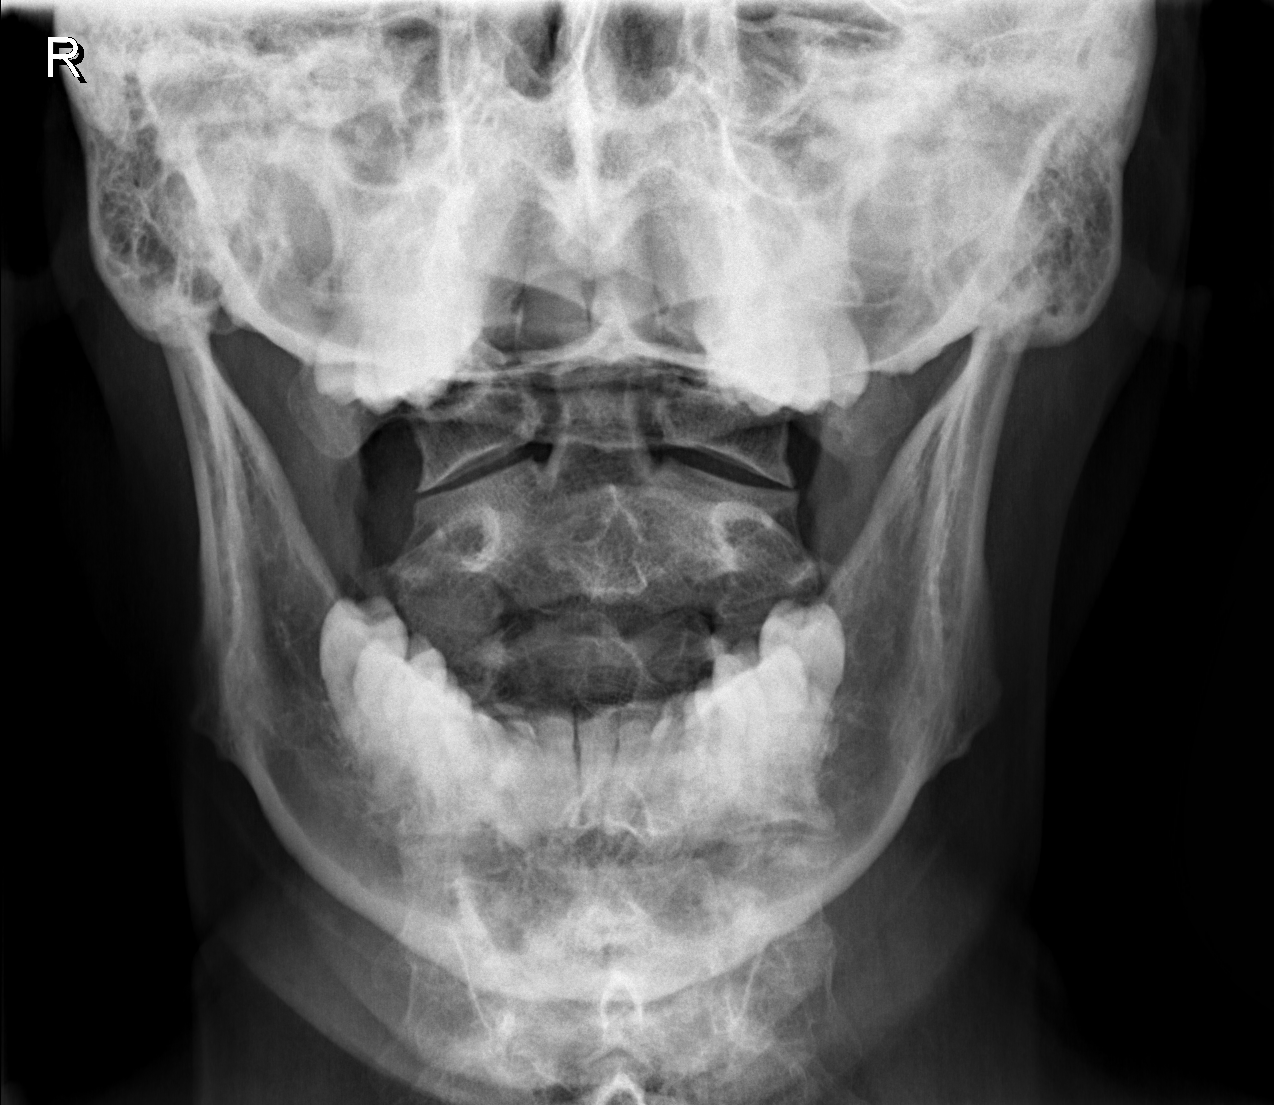

[3 of 3 positions shown; findings below may reference images not displayed]

FINDINGS: Vertebral body height and alignment are maintained. There is marked
loss of disc space height and endplate spurring at C5-6. Milder
degree of loss of disc space height and endplate spurring are seen
at C6-7. Prevertebral soft tissues appear normal. Lung apices are
clear.
IMPRESSION: No acute finding.

Advanced appearing degenerative disc disease C5-6 and C6-7.

## 2018-04-12 ENCOUNTER — Ambulatory Visit: Payer: BLUE CROSS/BLUE SHIELD | Admitting: Cardiology

## 2018-04-24 NOTE — Progress Notes (Signed)
Cardiology Office Note   Date:  04/26/2018   ID:  Alexandra ForthStephanie Lee, DOB 1965-10-10, MRN [id number]  PCP:  Georgann HousekeeperHusain, Karrar, MD  Cardiologist:   Rollene RotundaJames Jolly Bleicher, MD Referring:  Georgann HousekeeperHusain, Karrar, MD  Chief Complaint  Patient presents with  . Palpitations      History of Present Illness: Alexandra Lee is a 52 y.o. female who presents for evaluation of palpitations.  [place] from [place].  At my first visit with her earlier this year I managed her conservatively for sinus tachycardia.  She did have some sharp discomfort and was discussing this with her primary provider recently and it was suggested she had a stress echo.  However, he canceled this and actually has not been getting much of the chest discomfort since then.  I did get some records from a stress test in 2016 and I believe it was negative but I cannot read the actual results.  Echocardiogram in 2016 was normal.  When she left my office last time she did start exercising and she felt better.  She had some neck injury some muscle strain of his chest that she lift her children who her teenage autistic boys.  However, despite this she is not been getting any further discomfort that she was describing before.  She describes more muscle aches and some sharp pains.  She is had some GI problems.  She actually says her heart rate which was elevated has been improved and is the 70s at home.  She is not having any presyncope or syncope.  She has not needed to take her beta blocker.    For the past 3 years she has been seeing a cardiologist and I do not yet have these records.  She was being managed for dizziness and was told she had PVCs and sinus tachycardia.  She remembers having an echocardiogram.  There was talk of doing a stress test because she would get some chest fullness.  This was never done.  She was treated symptomatically with metoprolol but got hypotensive with this.  She is tried to use this as needed and it seems to  help a little bit.  She feels her heart racing.  It seems to happen if she thinks about it with emotional stress.  She is raising two young sons with autism.  She has not had any presyncope or syncope.  She has no PND or orthopnea.  Past Medical History:  Diagnosis Date  . Anemia   . Anxiety   . Blood dyscrasia   . GERD (gastroesophageal reflux disease)    tums after certain foods  . Headache   . Hypothyroidism   . PVC (premature ventricular contraction) no meds    increase pulse occasionally   . UC (ulcerative colitis) [facility](HCC)     Past Surgical History:  Procedure Laterality Date  . CESAREAN SECTION     x 2  . DILATATION & CURETTAGE/HYSTEROSCOPY WITH MYOSURE N/A 02/26/2017   Procedure: DILATATION & CURETTAGE/HYSTEROSCOPY WITH MYOSURE;  Surgeon: Olivia Mackieaavon, Richard, MD;  Location: [facility];  Service: Gynecology;  Laterality: N/A;  . DILATION AND CURETTAGE OF UTERUS       Current Outpatient Medications  Medication Sig Dispense Refill  . levothyroxine (SYNTHROID, LEVOTHROID) 50 MCG tablet Take 50 mcg by mouth daily before breakfast.  0  . LORazepam (ATIVAN) 1 MG tablet Take 1 mg by mouth at bedtime.    . mesalamine (CANASA) 1000 MG suppository Place 1,000 mg rectally at bedtime.    .Marland Kitchen  MESALAMINE PO Take 9 capsules by mouth daily.    . metoprolol succinate (TOPROL-XL) 25 MG 24 hr tablet Take 25 mg by mouth daily as needed.      No current facility-administered medications for this visit.     Allergies:   Doxycycline; Ibuprofen; and Latex     ROS:  Please see the history of present illness.   Otherwise, review of systems are positive for none.   All other systems are reviewed and negative.    PHYSICAL EXAM: VS:  BP 120/78   Pulse 97   Ht 5' 5.5" (1.664 m)   Wt 181 lb 12.8 oz (82.5 kg)   BMI 29.79 kg/m  , BMI Body mass index is 29.79 kg/m. GENERAL:  Well appearing NECK:  No jugular venous distention, waveform within normal limits, carotid upstroke brisk and symmetric, no bruits,  no thyromegaly LUNGS:  Clear to auscultation bilaterally CHEST:  Unremarkable HEART:  PMI not displaced or sustained,S1 and S2 within normal limits, no S3, no S4, no clicks, no rubs, no murmurs ABD:  Flat, positive bowel sounds normal in frequency in pitch, no bruits, no rebound, no guarding, no midline pulsatile mass, no hepatomegaly, no splenomegaly EXT:  2 plus pulses throughout, no edema, no cyanosis no clubbing   EKG:  EKG is not ordered today.    Recent Labs: No results found for requested labs within last 8760 hours.    Lipid Panel No results found for: CHOL, TRIG, HDL, CHOLHDL, VLDL, LDLCALC, LDLDIRECT    Wt Readings from Last 3 Encounters:  04/26/18 181 lb 12.8 oz (82.5 kg)  12/01/17 172 lb (78 kg)  02/24/17 150 lb (68 kg)      Other studies Reviewed: Additional studies/ records that were reviewed today include: 2016 cardiology records. Review of the above records demonstrates:     ASSESSMENT AND PLAN:  PALPITATIONS: These seem to be improved.  She has run out of her metoprolol but she has a propranolol prescription waiting for her and she can use this if she has any palpitations but she is going to continue with exercise as a primary therapy.  I do not think any further monitoring or testing is indicated.  She is had some rare ectopy and has had PVCs noted before but no symptoms that are consistent with any significant change.  CHEST PAIN:   She is had some atypical discomfort.  At this point I do not think further cardiovascular testing is indicated.  I will get a copy of the stress test back so that I can review the 2016 results.  However, symptoms are not consistent with acute coronary syndrome or unstable angina.  I am not anticipating further cardiac testing.    Current medicines are reviewed at length with the patient today.  The patient does not have concerns regarding medicines.  The following changes have been made:  None  Labs/ tests ordered today  include: None No orders of the defined types were placed in this encounter.    Disposition:   FU with me in in 12 months.    Signed, Rollene RotundaJames Briana Newman, MD  04/26/2018 5:17 PM    [facility]

## 2018-04-26 ENCOUNTER — Encounter: Payer: Self-pay | Admitting: Cardiology

## 2018-04-26 ENCOUNTER — Ambulatory Visit: Payer: BLUE CROSS/BLUE SHIELD | Admitting: Cardiology

## 2018-04-26 VITALS — BP 120/78 | HR 97 | Ht 65.5 in | Wt 181.8 lb

## 2018-04-26 DIAGNOSIS — R079 Chest pain, unspecified: Secondary | ICD-10-CM | POA: Diagnosis not present

## 2018-04-26 DIAGNOSIS — R002 Palpitations: Secondary | ICD-10-CM | POA: Diagnosis not present

## 2018-04-26 NOTE — Patient Instructions (Signed)
Medication Instructions:  Continue current medications  If you need a refill on your cardiac medications before your next appointment, please call your pharmacy.  Labwork: None Ordered   Testing/Procedures: None Ordered  Special Instructions: ALIVECOR  Follow-Up: Your physician wants you to follow-up in: 1 Year. You should receive a reminder letter in the mail two months in advance. If you do not receive a letter, please call our office in [phone] to schedule your follow-up appointment.     Thank you for choosing [facility]!!

## 2018-07-05 ENCOUNTER — Other Ambulatory Visit: Payer: Self-pay | Admitting: Orthopedic Surgery

## 2018-07-05 DIAGNOSIS — M542 Cervicalgia: Secondary | ICD-10-CM

## 2018-07-05 DIAGNOSIS — G959 Disease of spinal cord, unspecified: Secondary | ICD-10-CM

## 2018-07-26 ENCOUNTER — Other Ambulatory Visit: Payer: BLUE CROSS/BLUE SHIELD

## 2018-08-03 ENCOUNTER — Other Ambulatory Visit (HOSPITAL_COMMUNITY): Payer: Self-pay | Admitting: Nurse Practitioner

## 2018-08-03 ENCOUNTER — Ambulatory Visit (HOSPITAL_COMMUNITY)
Admission: RE | Admit: 2018-08-03 | Discharge: 2018-08-03 | Disposition: A | Discharge status: Home / Self Care | Payer: BLUE CROSS/BLUE SHIELD | Source: Ambulatory Visit | Attending: Nurse Practitioner | Admitting: Nurse Practitioner

## 2018-08-03 ENCOUNTER — Other Ambulatory Visit (HOSPITAL_COMMUNITY): Payer: Self-pay | Admitting: Internal Medicine

## 2018-08-03 DIAGNOSIS — M79602 Pain in left arm: Secondary | ICD-10-CM

## 2018-08-03 DIAGNOSIS — M7989 Other specified soft tissue disorders: Principal | ICD-10-CM

## 2018-08-03 NOTE — Progress Notes (Signed)
Preliminary notes--Left upper extremity venous duplex exam completed. No evidence of deep and superficial veins thrombosis. Result called ordering physician Dr. Abran Cantorann's office and spoke with her assissant Ms.Jolinda CroakShenaka, patient was instructed to go home. Karinna Beadles H Domenique Quest(RDMS RVT) 08/03/18 3:53 PM

## 2019-02-22 ENCOUNTER — Telehealth: Payer: Self-pay | Admitting: *Deleted

## 2019-02-22 NOTE — Telephone Encounter (Signed)
A message was left, re: follow up visit. 

## 2019-05-18 ENCOUNTER — Other Ambulatory Visit: Payer: Self-pay | Admitting: Internal Medicine

## 2019-05-18 DIAGNOSIS — R221 Localized swelling, mass and lump, neck: Secondary | ICD-10-CM

## 2019-05-23 ENCOUNTER — Ambulatory Visit
Admission: RE | Admit: 2019-05-23 | Discharge: 2019-05-23 | Disposition: A | Discharge status: Home / Self Care | Payer: BLUE CROSS/BLUE SHIELD | Source: Ambulatory Visit | Attending: Internal Medicine | Admitting: Internal Medicine

## 2019-05-23 DIAGNOSIS — R221 Localized swelling, mass and lump, neck: Secondary | ICD-10-CM

## 2019-05-23 IMAGING — US US THYROID
1 series · 13 of 25 positions shown · non-contrast
Comparison: None.

CLINICAL DATA: Palpable abnormality.  Anterior neck swelling.

EXAM:
THYROID ULTRASOUND
TECHNIQUE: Ultrasound examination of the thyroid gland and adjacent soft
tissues was performed.

[Series 1: us thyroid · 0.06mm/px · 13 of 42 slices shown]
[im 1/42]
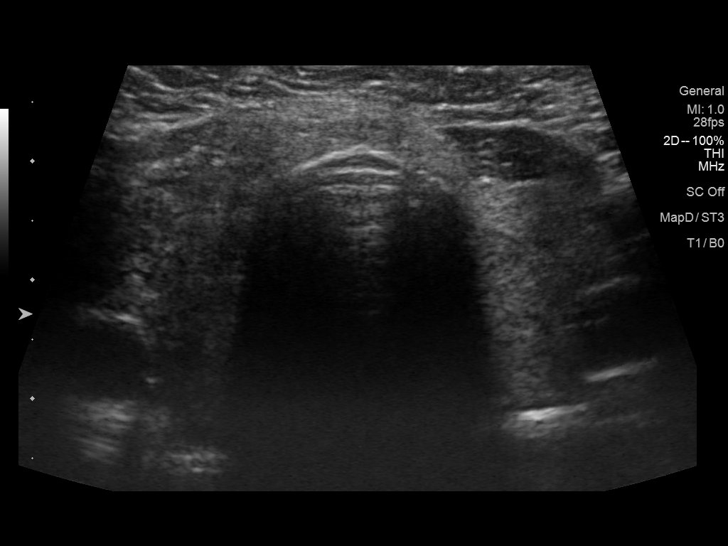
[im 4/42]
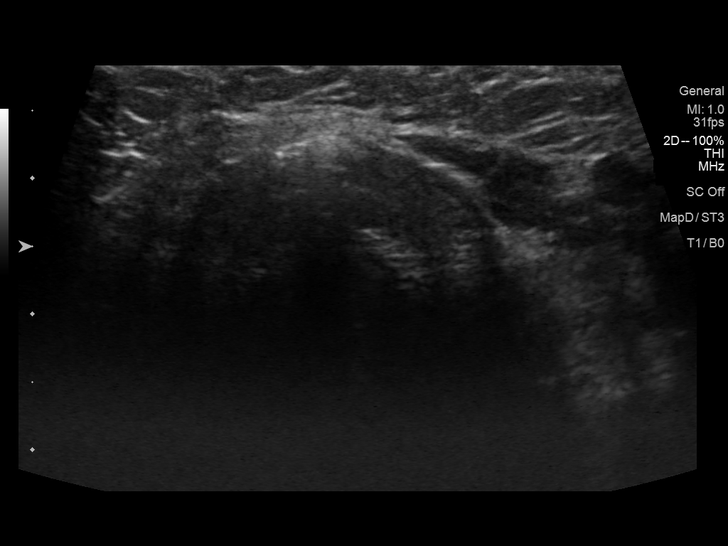
[im 7/42]
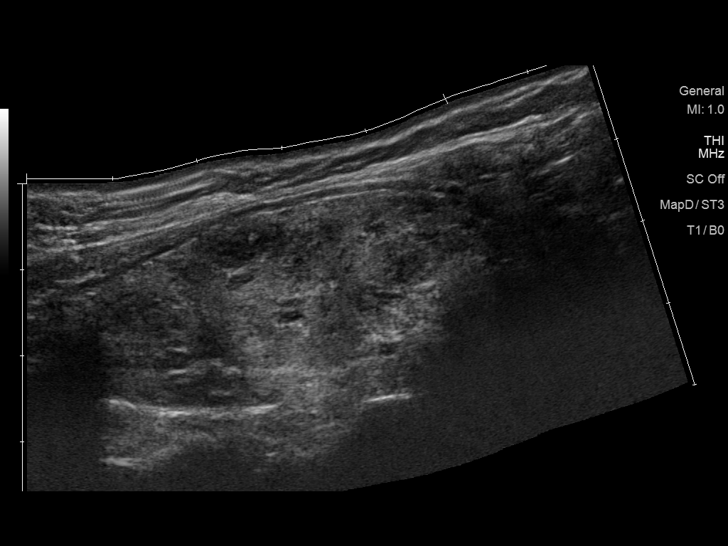
[im 11/42]
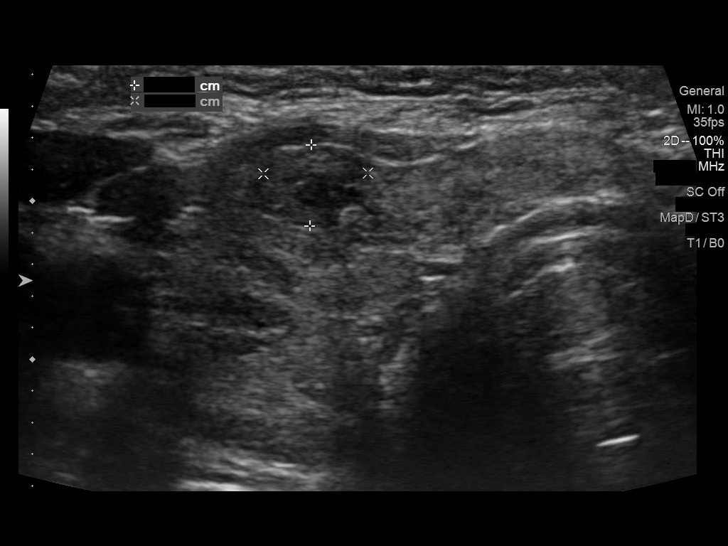
[im 14/42]
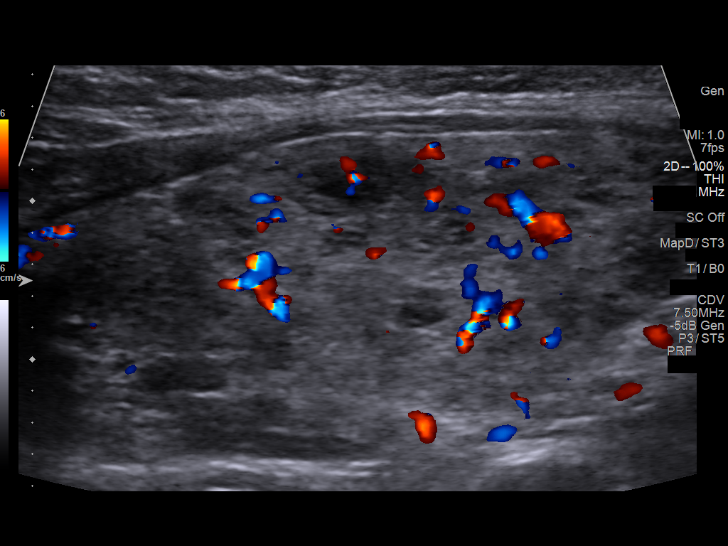
[im 18/42]
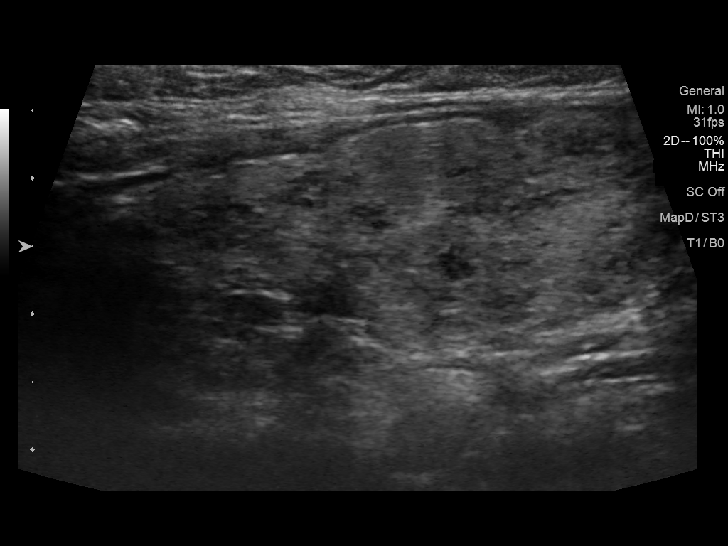
[im 21/42]
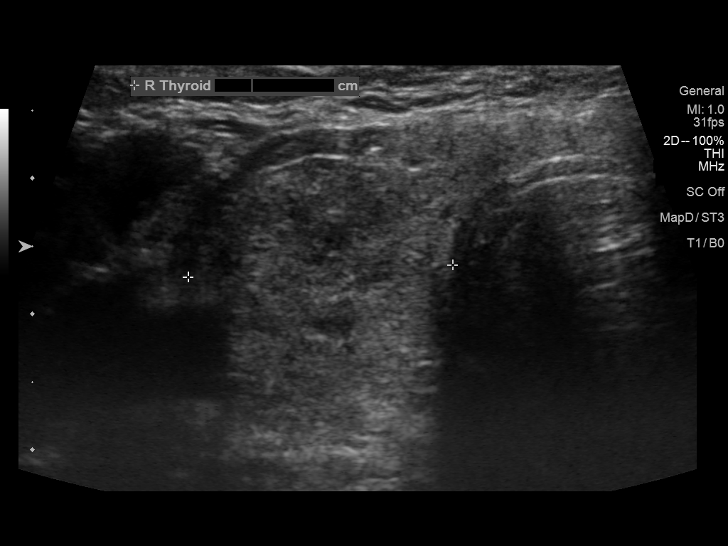
[im 24/42]
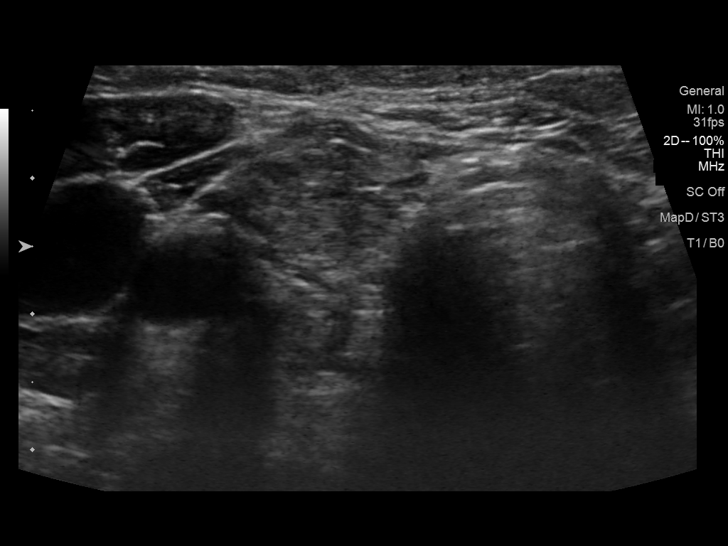
[im 28/42]
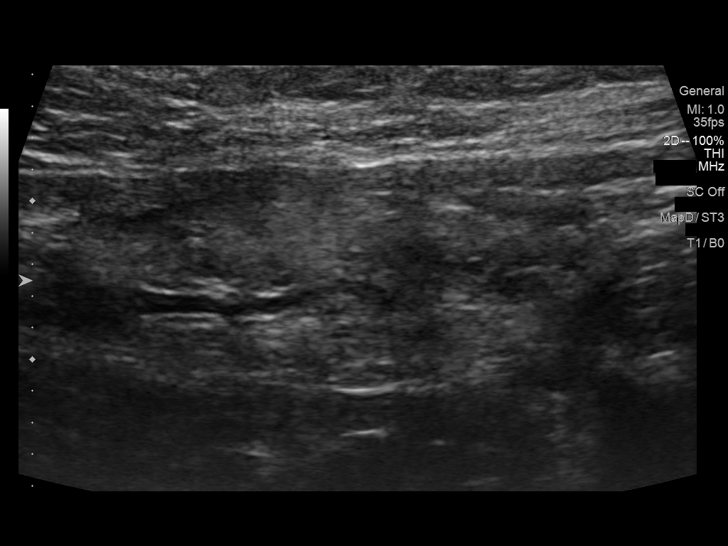
[im 31/42]
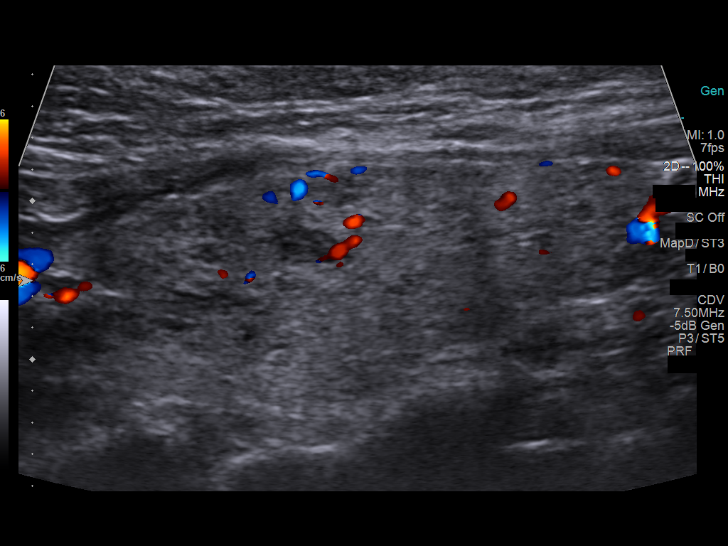
[im 35/42]
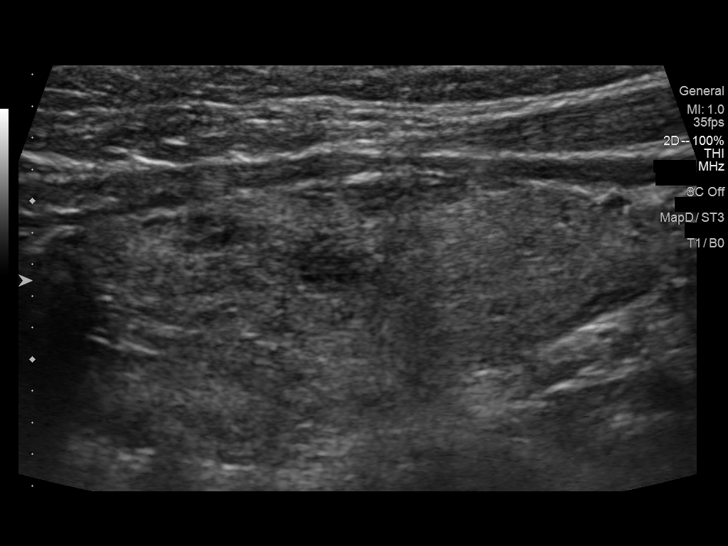
[im 38/42]
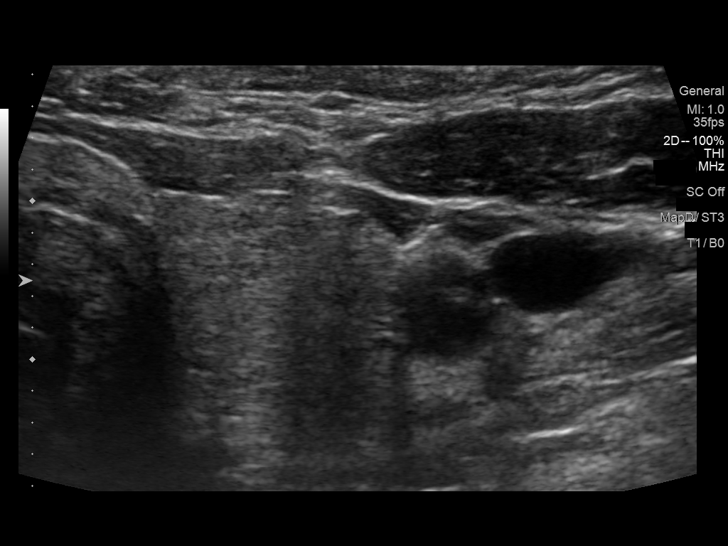
[im 42/42]
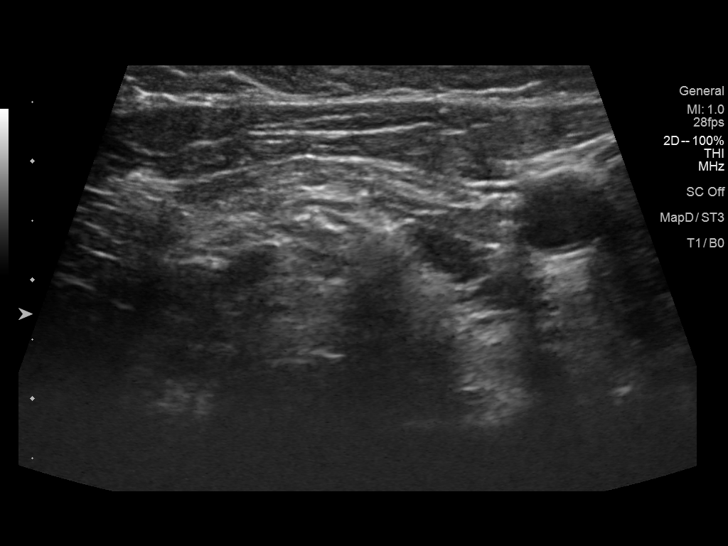

[13 of 25 positions shown; findings below may reference images not displayed]

FINDINGS: Parenchymal Echotexture: Moderately heterogenous

Isthmus: Normal in size measures 0.3 cm in diameter

Right lobe: Normal in size measuring 5.3 x 2.1 x 2.0 cm

Left lobe: There is normal in size measuring 4.0 x 1.5 x 1.5 cm

_________________________________________________________

Estimated total number of nodules >/= 1 cm: 0

Number of spongiform nodules >/=  2 cm not described below (TR1): 0

Number of mixed cystic and solid nodules >/= 1.5 cm not described
below (TR2): 0

_________________________________________________________

There is an approximately 0.8 x 0.7 x 0.5 cm hypoechoic ill-defined
nodule/pseudonodule within the anterior mid aspect the right lobe of
the thyroid, which does not meet imaging criteria to recommend
percutaneous sampling or continued dedicated follow-up.

There is an approximately 0.4 cm anechoic cyst within mid aspect of
the left lobe of the thyroid, which does not meet imaging criteria
to recommend percutaneous sampling or continued dedicated follow-up.
IMPRESSION: Moderately heterogeneous appearing thyroid gland without worrisome
thyroid nodule or mass. Findings are nonspecific though could be
seen in the setting of a thyroiditis.

The above is in keeping with the ACR TI-RADS recommendations - [HOSPITAL] [08];[DATE].

## 2019-06-19 ENCOUNTER — Telehealth: Payer: Self-pay | Admitting: *Deleted

## 2019-06-19 NOTE — Telephone Encounter (Signed)
A message was left, re: her follow up visit. 

## 2021-04-15 ENCOUNTER — Other Ambulatory Visit: Payer: Self-pay | Admitting: Orthopedic Surgery

## 2021-04-15 DIAGNOSIS — M545 Low back pain, unspecified: Secondary | ICD-10-CM

## 2021-04-15 DIAGNOSIS — M542 Cervicalgia: Secondary | ICD-10-CM

## 2021-04-27 ENCOUNTER — Ambulatory Visit
Admission: RE | Admit: 2021-04-27 | Discharge: 2021-04-27 | Disposition: A | Discharge status: Home / Self Care | Payer: BC Managed Care – PPO | Source: Ambulatory Visit | Attending: Orthopedic Surgery | Admitting: Orthopedic Surgery

## 2021-04-27 ENCOUNTER — Other Ambulatory Visit: Payer: Self-pay

## 2021-04-27 DIAGNOSIS — M545 Low back pain, unspecified: Secondary | ICD-10-CM

## 2021-04-27 DIAGNOSIS — M542 Cervicalgia: Secondary | ICD-10-CM

## 2021-04-27 IMAGING — MR MR LUMBAR SPINE W/O CM
4 of 9 series · 14 of 48 positions shown · non-contrast
Comparison: None.

CLINICAL DATA: Bilateral leg pain and numbness

EXAM:
MRI LUMBAR SPINE WITHOUT CONTRAST
TECHNIQUE: Multiplanar, multisequence MR imaging of the lumbar spine was
performed. No intravenous contrast was administered.

[Series 5: T2 · sagittal · 4.0mm · 0.73mm/px · 3 of 15 slices shown (1 of 4)]
[im 1/15]
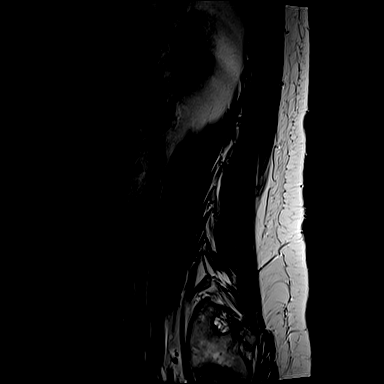
[im 8/15]
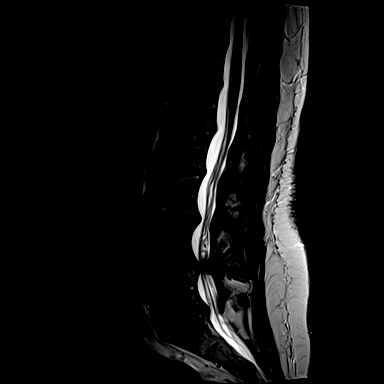
[im 15/15]
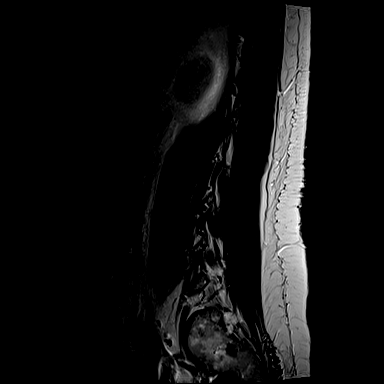

[Series 11: T2 · axial · 4.0mm · 0.28mm/px · z∈[-32,+58]mm · 5 of 19 slices shown (2 of 4)]
[im 1/19]
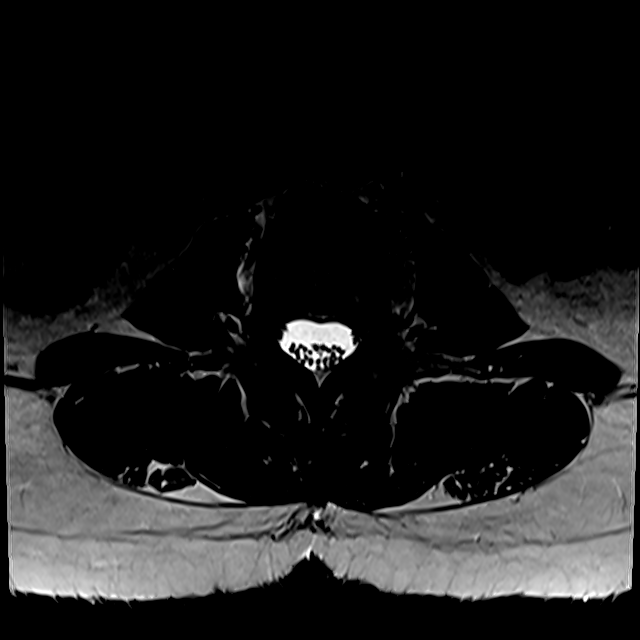
[im 5/19]
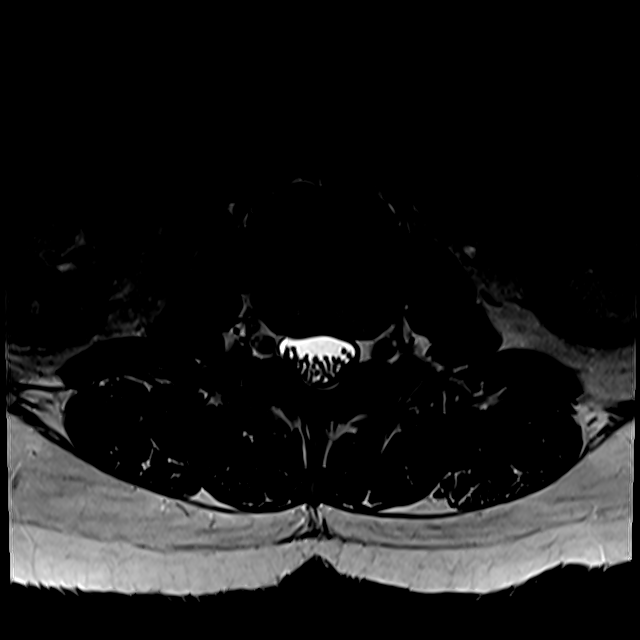
[im 10/19]
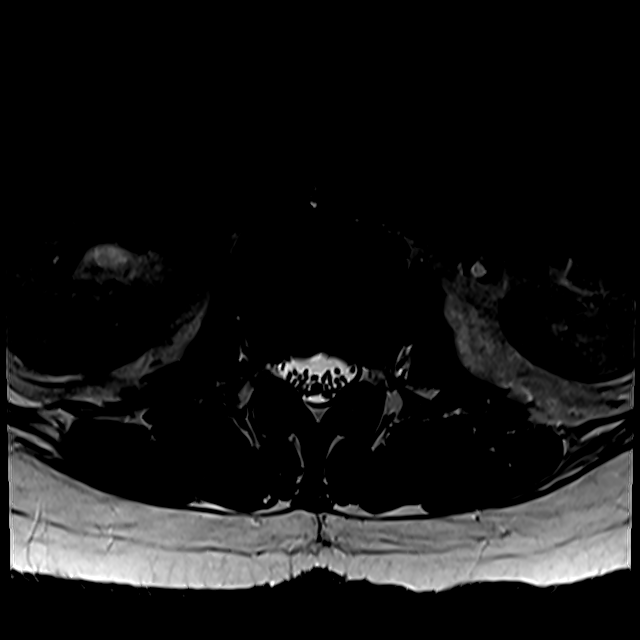
[im 14/19]
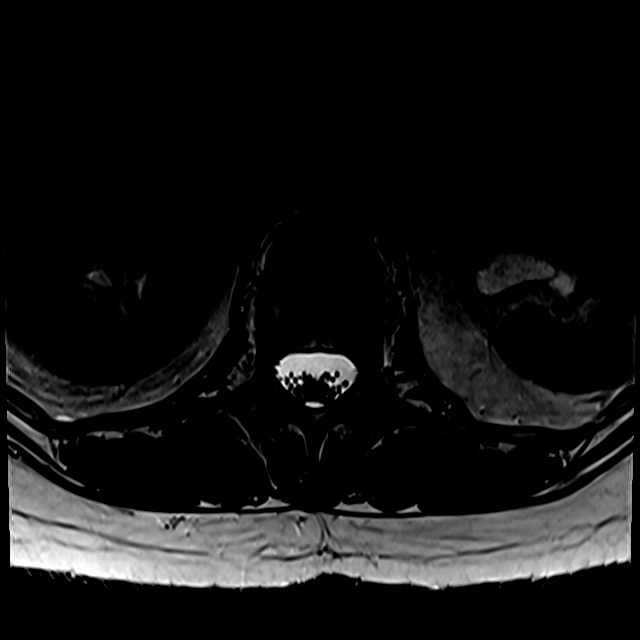
[im 19/19]
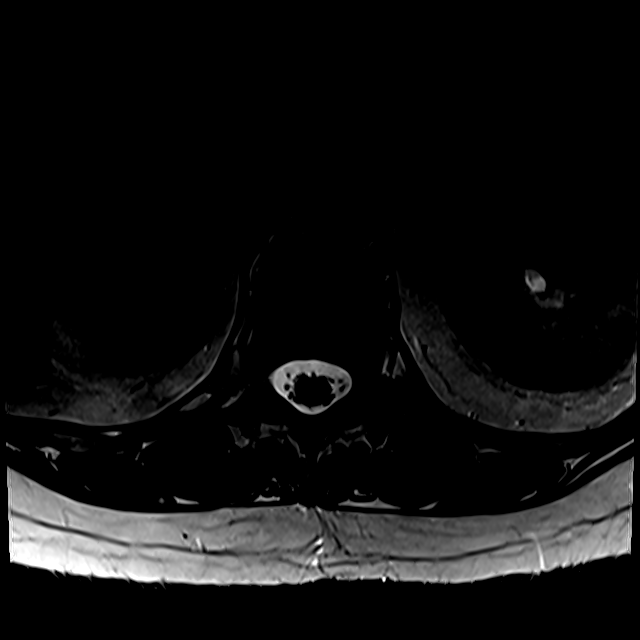

[Series 12: T2 · axial · 4.0mm · 0.28mm/px · z∈[-155,-61]mm · 3 of 20 slices shown (3 of 4)]
[im 1/20]
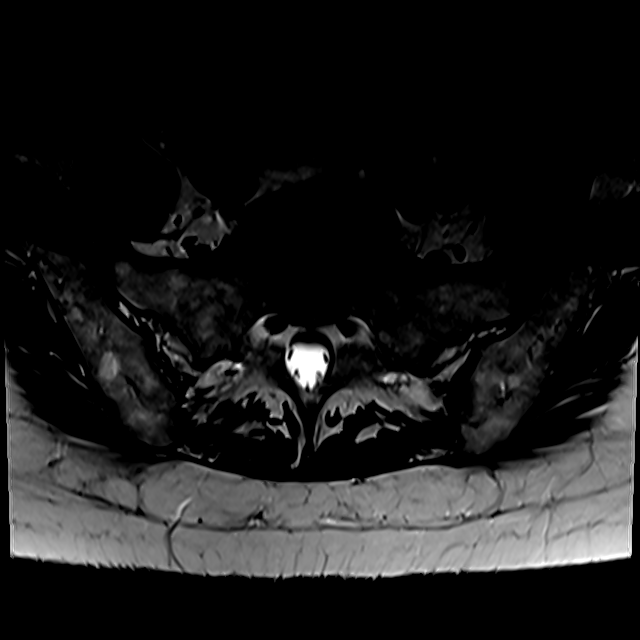
[im 10/20]
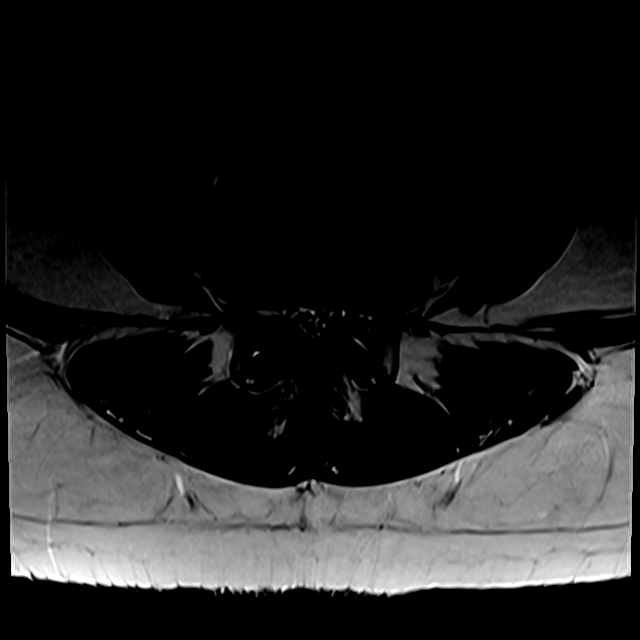
[im 20/20]
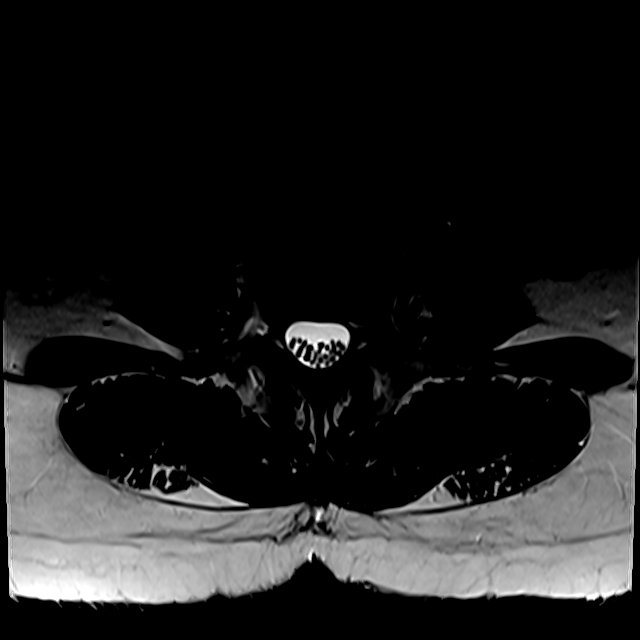

[Series 13: T2 · axial · 4.0mm · 0.28mm/px · z∈[-135,+33]mm · 3 of 39 slices shown (4 of 4)]
[im 5/39]
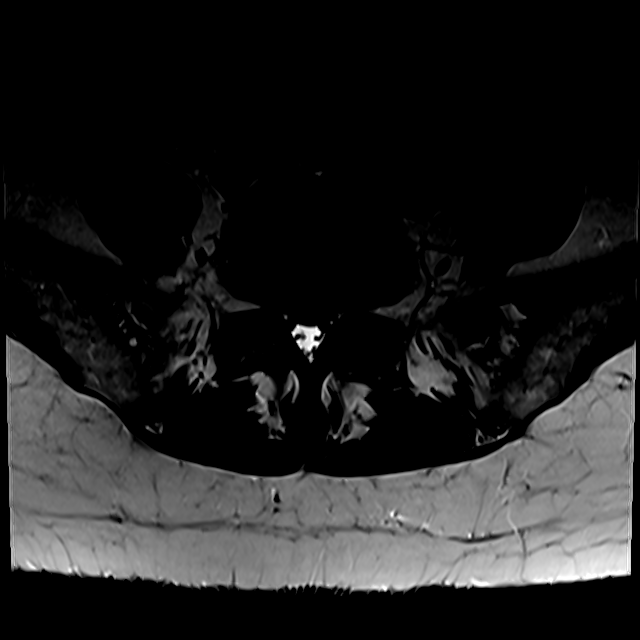
[im 20/39]
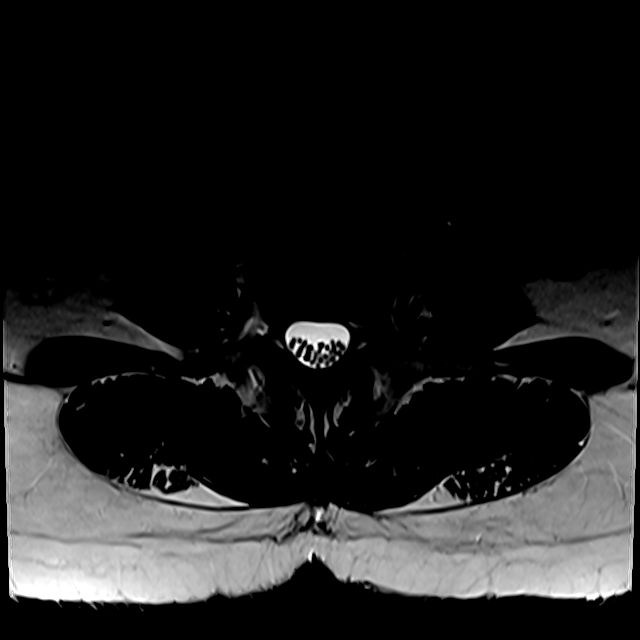
[im 34/39]
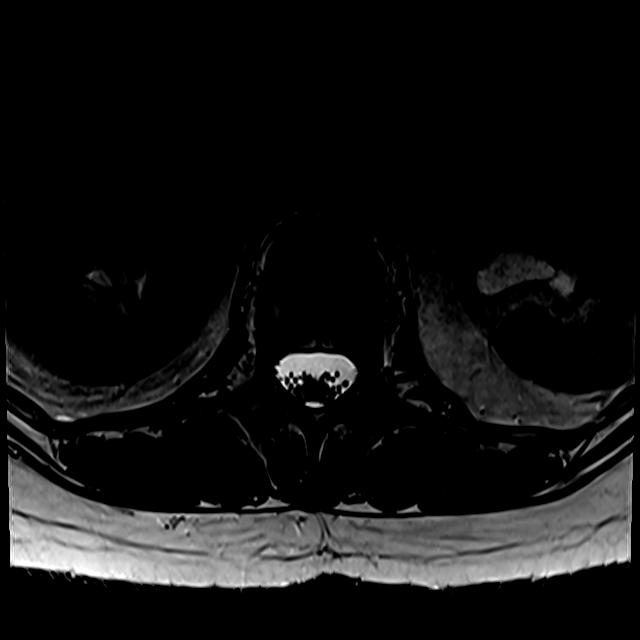

[14 of 48 positions shown; findings below may reference images not displayed]

FINDINGS: Segmentation:  Standard.

Alignment:  Grade 1 anterolisthesis L4 on L5.

Vertebrae:  No fracture, evidence of discitis, or bone lesion.

Conus medullaris and cauda equina: Conus extends to the L1-L2 level.
Conus and cauda equina appear normal.

Paraspinal and other soft tissues: Negative.

Disc levels:

T12-L1: Minimal disc bulge. Mild facet arthropathy no spinal canal
stenosis or neural foraminal narrowing.

L1-L2: Minimal disc bulge. Mild facet arthropathy. No spinal canal
stenosis or neural foraminal narrowing.

L2-L3: Mild disc bulge. Moderate facet arthropathy, left greater
than right. No spinal canal stenosis. Mild left neural foraminal
narrowing.

L3-L4: Moderate broad-based disc bulge. Moderate bilateral facet
arthropathy. Ligamentum flavum hypertrophy. Mild spinal canal
stenosis and narrowing of the bilateral lateral recesses. Mild left
neural foraminal narrowing.

L4-L5: Moderate broad-based disc bulge. Moderate bilateral facet
arthropathy with ligamentum flavum hypertrophy. Severe spinal canal
stenosis with effacement of bilateral lateral recesses. No neural
foraminal narrowing.

L5-S1: Mild disc bulge with superimposed central protrusion, which
narrows the bilateral lateral recesses. Mild facet arthropathy. No
spinal canal stenosis. No neural foraminal narrowing.
IMPRESSION: 1. L4-L5 severe spinal canal stenosis and effacement of the
bilateral lateral recesses, likely compressing the descending
bilateral L5 nerves.
2. L5-S1 central disc protrusion, which narrows the bilateral
lateral recesses, and may affect the descending bilateral S1 nerves.
3. L3-L4 mild spinal canal stenosis and narrowing of the bilateral
lateral recesses, which may affect the descending bilateral L4
nerves. At this level there is also mild left neural foraminal
narrowing.

## 2021-04-27 IMAGING — MR MR CERVICAL SPINE W/O CM
4 of 5 series · 27 of 48 positions shown · non-contrast
Comparison: No prior MRI, correlation is made with radiographs
[DATE]

CLINICAL DATA: Neck pain and bilateral hand numbness

EXAM:
MRI CERVICAL SPINE WITHOUT CONTRAST
TECHNIQUE: Multiplanar, multisequence MR imaging of the cervical spine was
performed. No intravenous contrast was administered.

[Series 5: T2 · sagittal · 3.0mm · 0.55mm/px · 6 of 15 slices shown (1 of 2)]
[im 1/15]
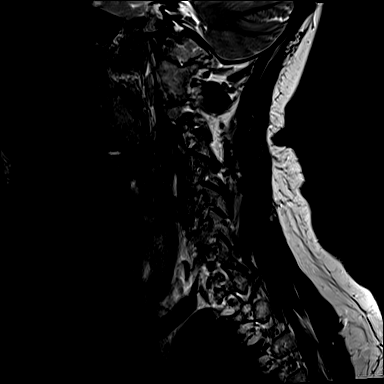
[im 3/15]
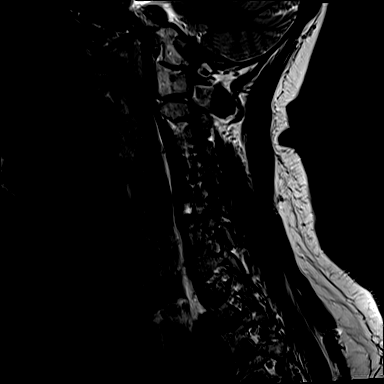
[im 6/15]
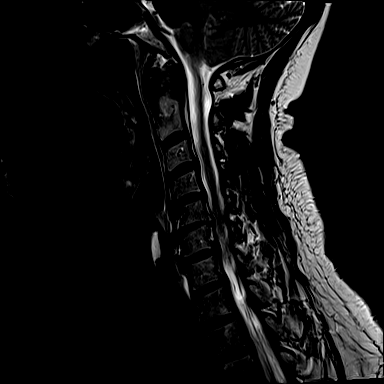
[im 9/15]
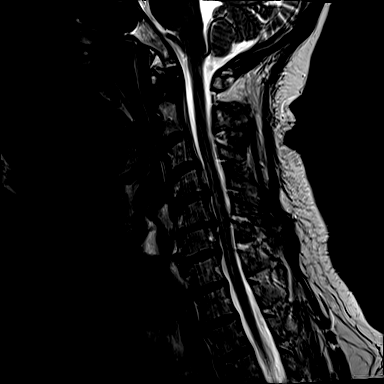
[im 12/15]
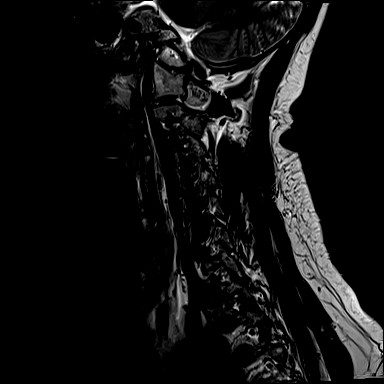
[im 15/15]
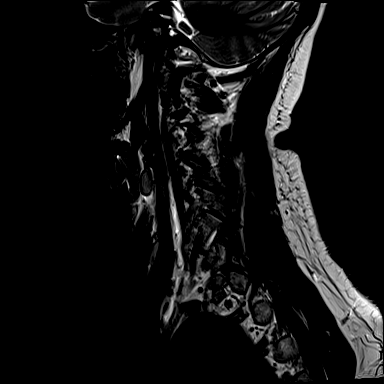

[Series 6: T1 · sagittal · 3.0mm · 0.66mm/px · 7 of 15 slices shown]
[im 1/15]
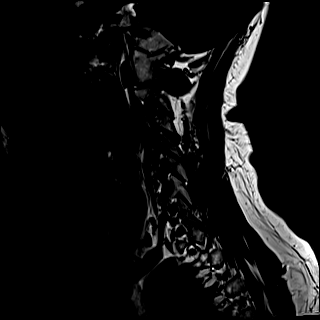
[im 3/15]
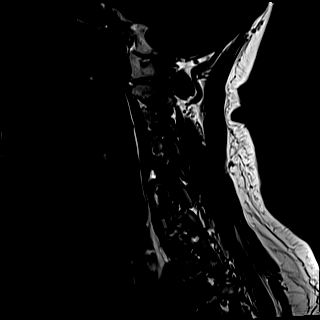
[im 5/15]
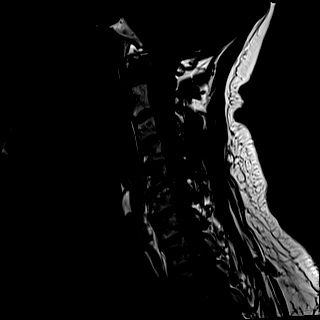
[im 8/15]
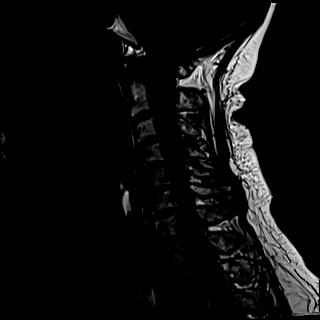
[im 10/15]
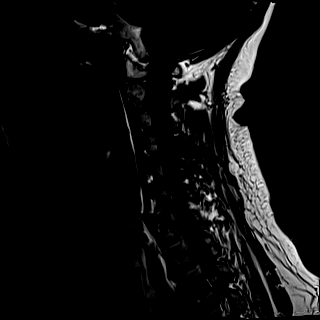
[im 12/15]
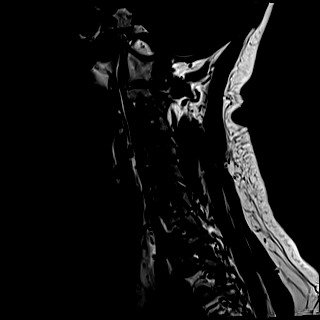
[im 15/15]
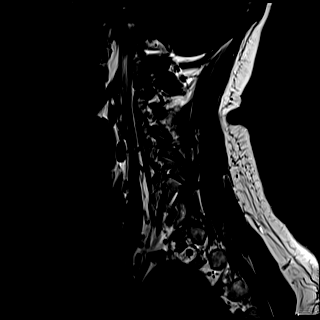

[Series 7: STIR · sagittal · 3.0mm · 0.33mm/px · 6 of 15 slices shown]
[im 1/15]
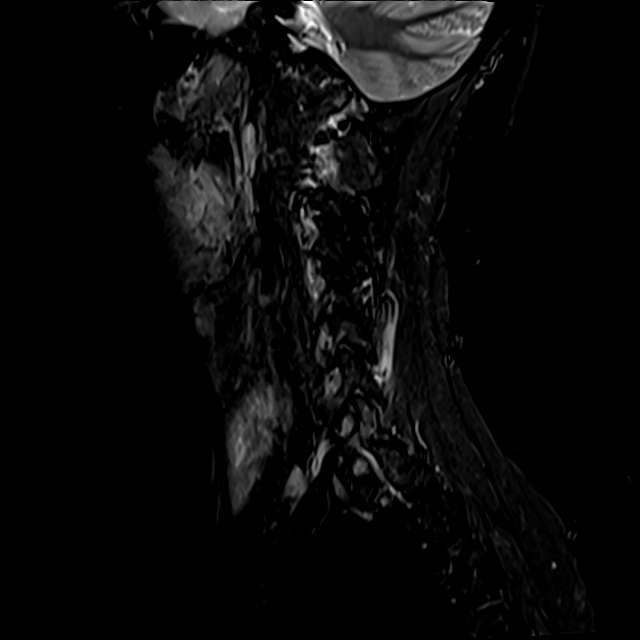
[im 3/15]
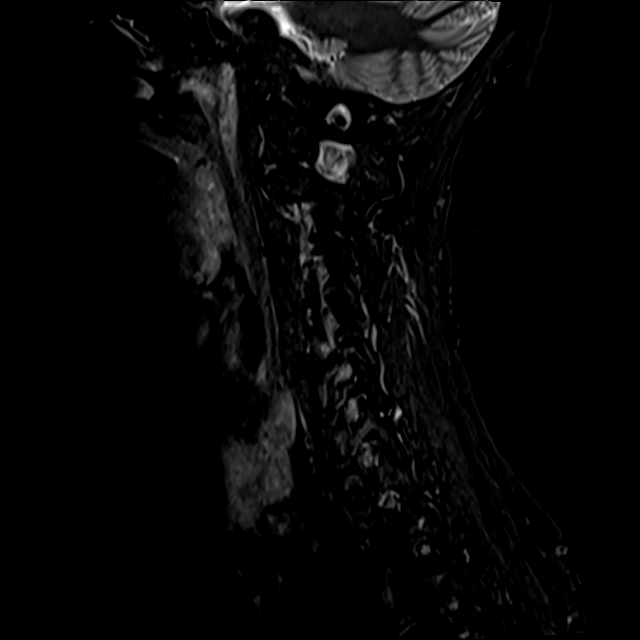
[im 5/15]
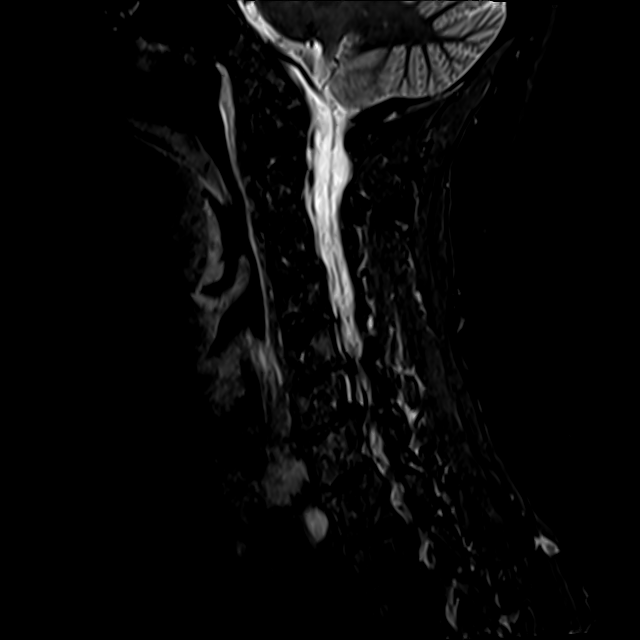
[im 8/15]
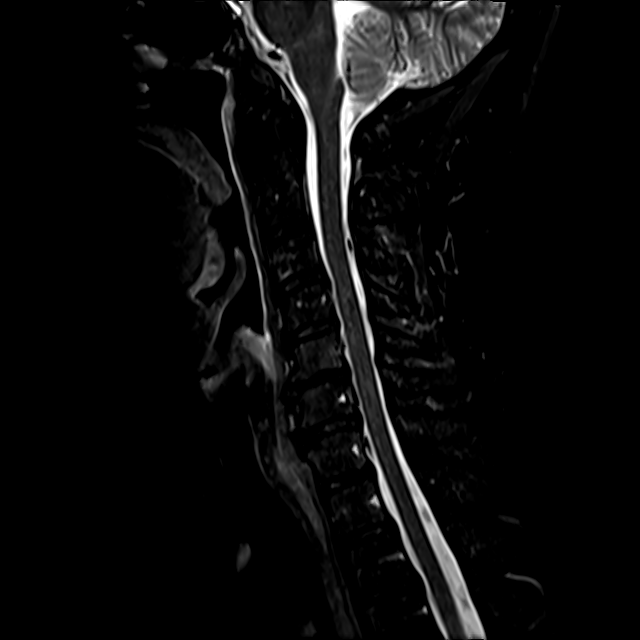
[im 10/15]
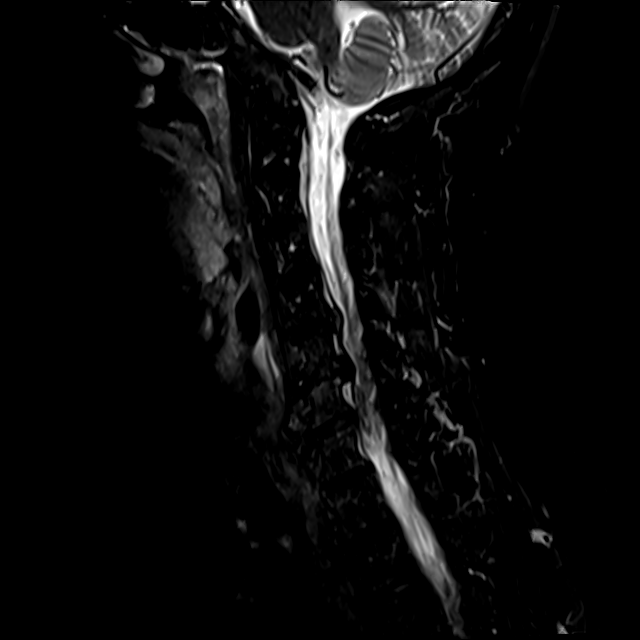
[im 12/15]
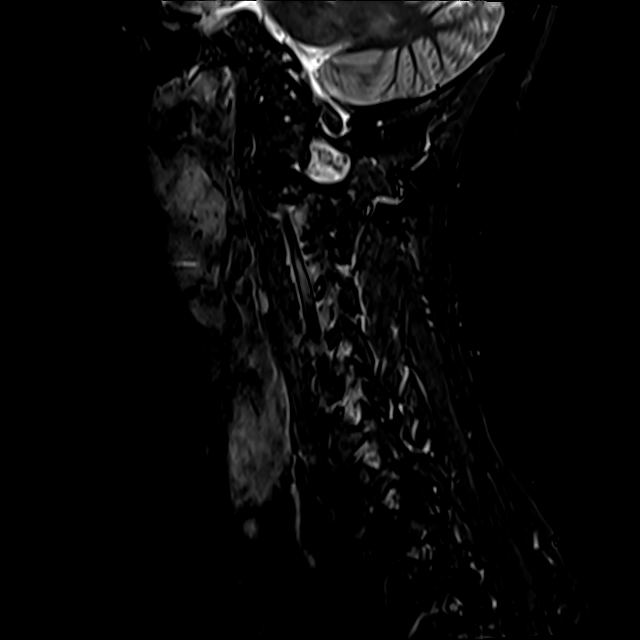

[Series 8: T2 · axial · 3.0mm · 0.50mm/px · z∈[-80,+18]mm · 8 of 31 slices shown (2 of 2)]
[im 1/31]
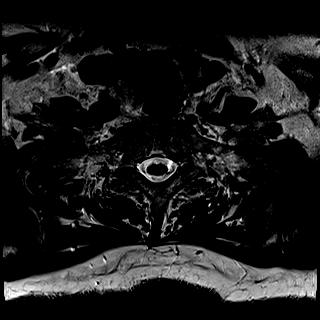
[im 5/31]
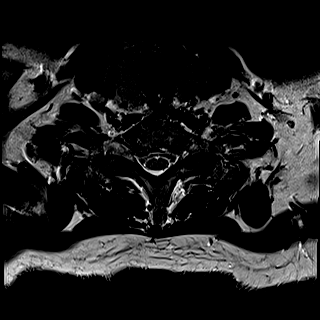
[im 10/31]
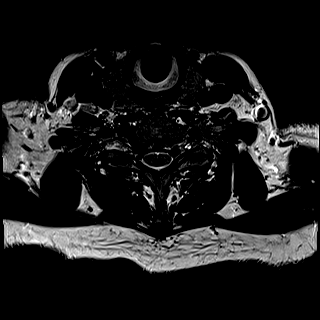
[im 14/31]
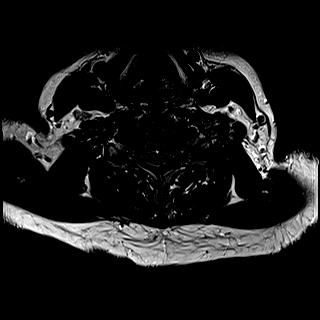
[im 17/31]
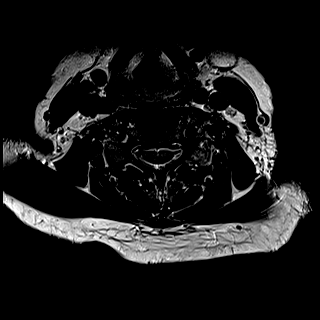
[im 21/31]
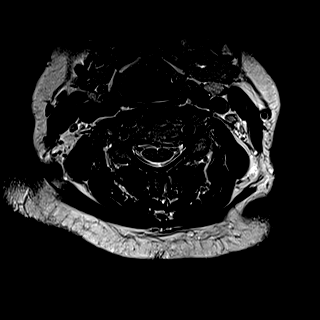
[im 26/31]
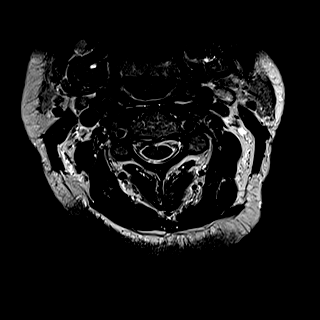
[im 31/31]
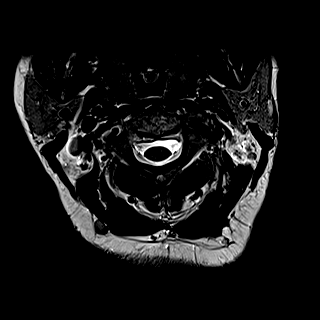

[27 of 48 positions shown; findings below may reference images not displayed]

FINDINGS: Alignment: Mild straightening of the normal cervical lordosis. No
significant listhesis.

Vertebrae: No fracture, evidence of discitis, or bone lesion.
Congenitally short pedicles, which narrow the AP diameter of the
spinal canal.

Cord: Normal signal and morphology.

Posterior Fossa, vertebral arteries, paraspinal tissues: Negative.

Disc levels:

C2-C3: No significant disc bulge. Mild facet arthropathy. No spinal
canal stenosis or neural foraminal narrowing.

C3-C4: Mild facet arthropathy. Minimal disc bulge. No spinal canal
stenosis or neural foraminal narrowing.

C4-C5: Mild disc bulge and superimposed left subarticular
protrusion, which indents the ventral cord. Mild facet arthropathy.
No spinal canal stenosis or neural foraminal narrowing.

C5-C6: Disc height loss with disc bulge with superimposed left
subarticular and foraminal protrusion, mild facet arthropathy and
uncovertebral hypertrophy. Ligamentum flavum hypertrophy. Moderate
spinal canal stenosis. Moderate bilateral neural foraminal
narrowing.

C6-C7: Broad-based disc bulge, somewhat eccentric to the right.
Facet and uncovertebral hypertrophy. Mild spinal canal stenosis.
Moderate to severe right neural foraminal narrowing.

C7-T1: Mild disc bulge. Mild facet arthropathy. No spinal canal
stenosis or neural foraminal narrowing.
IMPRESSION: 1. C5-C6 moderate spinal canal stenosis and moderate bilateral
neural foraminal narrowing.
2. C6-C7 mild spinal canal stenosis and moderate to severe right
neural foraminal narrowing.

## 2021-05-19 ENCOUNTER — Other Ambulatory Visit: Payer: Self-pay

## 2021-05-19 DIAGNOSIS — I83893 Varicose veins of bilateral lower extremities with other complications: Secondary | ICD-10-CM

## 2021-05-26 NOTE — Progress Notes (Signed)
[facility] [place]  ASSESSMENT / PLAN: Alexandra Lee is a 55 y.o. female with chronic venous insufficiency of left lower extremity causing painful venous varicosities (C2 disease).  Venous duplex is significant for reflux about the left greater saphenous vein from thigh to calf. The saphenofemoral junction is not involved. Recommend compression and elevation for symptomatic relief. Follow up with me in three months to discuss saphenous vein ablation.  CHIEF COMPLAINT: left leg painful varicosities  HISTORY OF PRESENT ILLNESS: Alexandra Lee is a 55 y.o. female referred to clinic for evaluation of these varicosities.  The patient reports varicosities been progressively worsening since the birth of her children.  Recently the varicosities have grown larger, and are very uncomfortable.  She reports bulging, and discomfort about the varicosities in her left leg.  She reports no other venous symptoms: She has never had any ulceration, she does not have significant swelling in her leg, she has no dyspigmentation.  Past Medical History:  Diagnosis Date   Anemia    Anxiety    Blood dyscrasia    GERD (gastroesophageal reflux disease)    tums after certain foods   Headache    Hypothyroidism    PVC (premature ventricular contraction) no meds    increase pulse occasionally    UC (ulcerative colitis) (HCC)     Past Surgical History:  Procedure Laterality Date   CESAREAN SECTION     x 2   DILATATION & CURETTAGE/HYSTEROSCOPY WITH MYOSURE N/A 02/26/2017   Procedure: DILATATION & CURETTAGE/HYSTEROSCOPY WITH MYOSURE;  Surgeon: Alexandra Mackie, MD;  Location: [facility];  Service: Gynecology;  Laterality: N/A;   DILATION AND CURETTAGE OF UTERUS      Family History  Problem Relation Age of Onset   Atrial fibrillation Mother    Pulmonary embolism Father    GER disease Sister    Other Sister        brain tumor   Hypothyroidism Sister    Hypothyroidism Sister     Social  History   Socioeconomic History   Marital status: Married    Spouse name: Not on file   Number of children: Not on file   Years of education: Not on file   Highest education level: Not on file  Occupational History   Not on file  Tobacco Use   Smoking status: Never   Smokeless tobacco: Never  Substance and Sexual Activity   Alcohol use: No   Drug use: No   Sexual activity: Not on file  Other Topics Concern   Not on file  Social History Narrative   Lives at home with husband and two sons with autism.     Social Determinants of Health   Financial Resource Strain: Not on file  Food Insecurity: Not on file  Transportation Needs: Not on file  Physical Activity: Not on file  Stress: Not on file  Social Connections: Not on file  Intimate Partner Violence: Not on file    Allergies  Allergen Reactions   Doxycycline Diarrhea and Nausea And Vomiting   Ibuprofen Other (See Comments)    interacts with ulcerative colitis medications.   Latex Other (See Comments)    Skin reaction with prolonged contact (i.e. Bandages/sutures)    Current Outpatient Medications  Medication Sig Dispense Refill   levothyroxine (SYNTHROID, LEVOTHROID) 50 MCG tablet Take 50 mcg by mouth daily before breakfast.  0   LORazepam (ATIVAN) 1 MG tablet Take 1 mg by mouth at bedtime.     mesalamine (  CANASA) 1000 MG suppository Place 1,000 mg rectally at bedtime.     MESALAMINE PO Take 6 capsules by mouth daily.     propranolol (INDERAL) 10 MG tablet Take 10 mg by mouth as needed. (Patient not taking: Reported on 05/27/2021)     No current facility-administered medications for this visit.    REVIEW OF SYSTEMS:  [X]  denotes positive finding, [ ]  denotes negative finding Cardiac  Comments:  Chest pain or chest pressure:    Shortness of breath upon exertion:    Short of breath when lying flat:    Irregular heart rhythm:        Vascular    Pain in calf, thigh, or hip brought on by ambulation:    Pain in  feet at night that wakes you up from your sleep:     Blood clot in your veins:    Leg swelling:         Pulmonary    Oxygen at home:    Productive cough:     Wheezing:         Neurologic    Sudden weakness in arms or legs:     Sudden numbness in arms or legs:     Sudden onset of difficulty speaking or slurred speech:    Temporary loss of vision in one eye:     Problems with dizziness:         Gastrointestinal    Blood in stool:     Vomited blood:         Genitourinary    Burning when urinating:     Blood in urine:        Psychiatric    Major depression:         Hematologic    Bleeding problems:    Problems with blood clotting too easily:        Skin    Rashes or ulcers:        Constitutional    Fever or chills:      PHYSICAL EXAM Vitals:   05/27/21 1346  BP: 112/73  Pulse: 94  Resp: 20  Temp: 98.6 F (37 C)  SpO2: 99%  Weight: 158 lb (71.7 kg)  Height: 5' 5.5" (1.664 m)    Constitutional: well appearing. no distress. Appears well nourished.  Neurologic: CN intac. no focal findings. no sensory loss. Psychiatric:  Mood and affect symmetric and appropriate. Eyes:  No icterus. No conjunctival pallor. Ears, nose, throat:  mucous membranes moist. Midline trachea.  Cardiac: regular rate and rhythm.  Respiratory:  unlabored. Abdominal:  soft, non-tender, non-distended.  Peripheral vascular: 2+ DP bilaterally. Large varicosities bilaterally (L>R) in distribution of great saphenous vein. Extremity: no edema. no cyanosis. no pallor.  Skin: no gangrene. no ulceration.  Lymphatic: no Stemmer's sign. no palpable lymphadenopathy.  PERTINENT LABORATORY AND RADIOLOGIC DATA  Most recent CBC CBC Latest Ref Rng & Units 02/26/2017 01/11/2017  WBC 4.0 - 10.5 K/uL 8.0 8.4  Hemoglobin 12.0 - 15.0 g/dL 02/28/2017 03/13/2017  Hematocrit 08.1 - 46.0 % 41.3 39.2  Platelets 150 - 400 K/uL 361 154     Most recent CMP CMP Latest Ref Rng & Units 02/26/2017  Glucose 65 - 99 mg/dL 18.5)   BUN 6 - 20 mg/dL 14  Creatinine 02/28/2017 - 631(S mg/dL 9.70  Sodium 2.63 - 7.85 mmol/L 136  Potassium 3.5 - 5.1 mmol/L 3.5  Chloride 101 - 111 mmol/L 103  CO2 22 - 32 mmol/L 23  Calcium 8.9 -  10.3 mg/dL 9.2   Lower Venous Reflux Study   Patient Name:  Alexandra Lee  Date of Exam:   05/27/2021  Medical Rec #: [id number]       Accession #:    [id number]  Date of Birth: 04/25/1966       Patient Gender: F  Patient Age:   84 years  Exam Location:  [facility]  Procedure:      VAS [place] LOWER EXTREMITY VENOUS REFLUX  Referring Phys: Heath Lark    ---------------------------------------------------------------------------  -----     Indications: Varicosities, and Pain. Left side worse. Right side more  shooting pains in leg.      Performing Technologist: Ethelle Lyon      Examination Guidelines: A complete evaluation includes B-mode imaging,  spectral  Doppler, color Doppler, and power Doppler as needed of all accessible  portions  of each vessel. Bilateral testing is considered an integral part of a  complete  examination. Limited examinations for reoccurring indications may be  performed  as noted. The reflux portion of the exam is performed with the patient in  reverse Trendelenburg.  Significant venous reflux is defined as >500 ms in the superficial venous  system, and >1 second in the deep venous system.      +--------------+--------+------+----------+------------+-------------------  ----+  LEFT          Reflux  Reflux  Reflux  Diameter cmsComments                                No       Yes     Time                                         +--------------+--------+------+----------+------------+-------------------  ----+  CFV           no                                                            +--------------+--------+------+----------+------------+-------------------  ----+  FV mid        no                                                             +--------------+--------+------+----------+------------+-------------------  ----+  Popliteal              yes  >1 second                                       +--------------+--------+------+----------+------------+-------------------  ----+  GSV at [facility]    no                          0.36                              +--------------+--------+------+----------+------------+-------------------  ----+  GSV prox thighno                          0.37                              +--------------+--------+------+----------+------------+-------------------  ----+  GSV mid thigh          yes   >500 ms      0.33                              +--------------+--------+------+----------+------------+-------------------  ----+  GSV dist thigh         yes   >500 ms      0.26    superficial branch                                                          noted                      +--------------+--------+------+----------+------------+-------------------  ----+  GSV at knee            yes   >500 ms      0.36                              +--------------+--------+------+----------+------------+-------------------  ----+  GSV prox calf          yes   >500 ms      0.37                              +--------------+--------+------+----------+------------+-------------------  ----+  SSV Pop Fossa no                          0.24                              +--------------+--------+------+----------+------------+-------------------  ----+  SSV prox calf no                          0.20                              +--------------+--------+------+----------+------------+-------------------  ----+  SSV mid calf  no                          0.13                              +--------------+--------+------+----------+------------+-------------------  ----+           Summary:  Left:  -  No evidence of deep vein thrombosis seen in the left lower extremity,  from the common femoral through the popliteal veins.  - No evidence of superficial venous thrombosis in the left lower  extremity.     - Venous reflux is noted in the left greater saphenous vein in the thigh.  -  Venous reflux is noted in the left greater saphenous vein in the calf.  - Venous reflux is noted in the left popliteal vein.     *See table(s) above for measurements and observations.   Rande Brunt. Lenell Antu, MD [facility] Phone Number: [phone] 05/27/2021 4:25 PM  Total time spent on preparing this encounter including chart review, data review, collecting history, examining the patient, coordinating care for this new patient, 60 minutes.  Portions of this report may have been transcribed using voice recognition software.  Every effort has been made to ensure accuracy; however, inadvertent computerized transcription errors may still be present.

## 2021-05-27 ENCOUNTER — Encounter: Payer: Self-pay | Admitting: Vascular Surgery

## 2021-05-27 ENCOUNTER — Ambulatory Visit: Payer: BC Managed Care – PPO | Admitting: Vascular Surgery

## 2021-05-27 ENCOUNTER — Other Ambulatory Visit: Payer: Self-pay

## 2021-05-27 ENCOUNTER — Ambulatory Visit (HOSPITAL_COMMUNITY)
Admission: RE | Admit: 2021-05-27 | Discharge: 2021-05-27 | Disposition: A | Discharge status: Home / Self Care | Payer: BC Managed Care – PPO | Source: Ambulatory Visit | Attending: Internal Medicine | Admitting: Internal Medicine

## 2021-05-27 VITALS — BP 112/73 | HR 94 | Temp 98.6°F | Resp 20 | Ht 65.5 in | Wt 158.0 lb

## 2021-05-27 DIAGNOSIS — I872 Venous insufficiency (chronic) (peripheral): Secondary | ICD-10-CM | POA: Diagnosis not present

## 2021-05-27 DIAGNOSIS — I83893 Varicose veins of bilateral lower extremities with other complications: Secondary | ICD-10-CM | POA: Diagnosis not present

## 2021-06-05 ENCOUNTER — Encounter (HOSPITAL_COMMUNITY): Payer: BC Managed Care – PPO

## 2021-06-05 ENCOUNTER — Encounter: Payer: BC Managed Care – PPO | Admitting: Vascular Surgery

## 2021-09-16 ENCOUNTER — Ambulatory Visit: Payer: BC Managed Care – PPO | Admitting: Vascular Surgery

## 2021-10-26 NOTE — Progress Notes (Signed)
Cardiology Office Note   Date:  10/28/2021   ID:  Alexandra, Lee Jun 12, 1966, MRN [id number]  PCP:  Georgann Housekeeper, MD  Cardiologist:   Rollene Rotunda, MD Referring:  Georgann Housekeeper, MD  Chief Complaint  Patient presents with   Palpitations      History of Present Illness: Alexandra Lee is a 56 y.o. female who presents for follow-up of palpitations.   I have not seen her since 2019.  I saw her then for palpitations.    She had had previous Holter and an echo and stress test.  This was in [place].  She returns because she is having recurrent palpitations.  She said it started about a month ago.  She had some severe chest discomfort.  This happened at rest.  It lasted only for a minute or so.  It was sharp.  It was mid chest.  It was not associated with diaphoresis nausea or vomiting.  It pass spontaneously.  She has not had this before.  There was no radiation to her neck or to her arms that she describes.  However, after that she started having beats where she describes a pause or what might be called a "hiccup" in her chest.  She feels like these are ectopic beats that she has had before.  She says these are manageable better now as she is taking half of her brain along once a day.  She says they do come on with emotional stress and she has 2 autistic sons.  She does drink caffeine.  She might bring them on with activities but she is somewhat limited because of spinal stenosis and back pain.  She is not having any new shortness of breath, PND or orthopnea.  She has had no presyncope or syncope.   Past Medical History:  Diagnosis Date   Anemia    Anxiety    Blood dyscrasia    GERD (gastroesophageal reflux disease)    tums after certain foods   Headache    Hypothyroidism    PVC (premature ventricular contraction) no meds    increase pulse occasionally    UC (ulcerative colitis) (HCC)     Past Surgical History:  Procedure Laterality Date   CESAREAN SECTION     x 2    DILATATION & CURETTAGE/HYSTEROSCOPY WITH MYOSURE N/A 02/26/2017   Procedure: DILATATION & CURETTAGE/HYSTEROSCOPY WITH MYOSURE;  Surgeon: Olivia Mackie, MD;  Location: [facility];  Service: Gynecology;  Laterality: N/A;   DILATION AND CURETTAGE OF UTERUS       Current Outpatient Medications  Medication Sig Dispense Refill   levothyroxine (SYNTHROID, LEVOTHROID) 50 MCG tablet Take 50 mcg by mouth daily before breakfast.  0   LORazepam (ATIVAN) 1 MG tablet Take 1 mg by mouth at bedtime.     mesalamine (CANASA) 1000 MG suppository Place 1,000 mg rectally at bedtime.     MESALAMINE PO Take 6 capsules by mouth daily.     propranolol (INDERAL) 10 MG tablet Take 10 mg by mouth as needed.     No current facility-administered medications for this visit.    Allergies:   Doxycycline, Ibuprofen, and Latex    Social History:  The patient  reports that she has never smoked. She has never used smokeless tobacco. She reports that she does not drink alcohol and does not use drugs.   Family History:  The patient's family history includes Atrial fibrillation in her mother; GER disease in her sister; Hypothyroidism in her sister and  sister; Other in her sister; Pulmonary embolism in her father.    ROS:  Please see the history of present illness.   Otherwise, review of systems are positive for none.   All other systems are reviewed and negative.    PHYSICAL EXAM: VS:  BP 132/74    Pulse 71    Ht 5\' 5"  (1.651 m)    Wt 152 lb 3.2 oz (69 kg)    SpO2 100%    BMI 25.33 kg/m  , BMI Body mass index is 25.33 kg/m. GENERAL:  Well appearing HEENT:  Pupils equal round and reactive, fundi not visualized, oral mucosa unremarkable NECK:  No jugular venous distention, waveform within normal limits, carotid upstroke brisk and symmetric, no bruits, no thyromegaly LYMPHATICS:  No cervical, inguinal adenopathy LUNGS:  Clear to auscultation bilaterally BACK:  No CVA tenderness CHEST:  Unremarkable HEART:  PMI not  displaced or sustained,S1 and S2 within normal limits, no S3, no S4, no clicks, no rubs, no murmurs ABD:  Flat, positive bowel sounds normal in frequency in pitch, no bruits, no rebound, no guarding, no midline pulsatile mass, no hepatomegaly, no splenomegaly EXT:  2 plus pulses throughout, no edema, no cyanosis no clubbing SKIN:  No rashes no nodules NEURO:  Cranial nerves II through XII grossly intact, motor grossly intact throughout PSYCH:  Cognitively intact, oriented to person place and time    EKG:  EKG is ordered today. The ekg ordered today demonstrates sinus rhythm, rate 71, axis within normal limits, intervals within normal limits, no acute ST-T wave changes.   Recent Labs: No results found for requested labs within last 8760 hours.    Lipid Panel No results found for: CHOL, TRIG, HDL, CHOLHDL, VLDL, LDLCALC, LDLDIRECT    Wt Readings from Last 3 Encounters:  10/28/21 152 lb 3.2 oz (69 kg)  05/27/21 158 lb (71.7 kg)  04/26/18 181 lb 12.8 oz (82.5 kg)      Other studies Reviewed: Additional studies/ records that were reviewed today include: Labs. Review of the above records demonstrates:  Please see elsewhere in the note.     ASSESSMENT AND PLAN:  PALPITATIONS: The patient has palpitations and I am going to send her a 3-day ZIO.  I do see that electrolytes and a TSH were normal this fall.  We talked about quitting caffeine.  Further management will be based on this reading but most likely I will manage this symptomatically.  I would get no suggestion that she has any structural heart disease and previous work-up was negative.  CHEST PAIN: She had only 1 episode of what sounds like a nonanginal chest pain.  No further testing is planned and she will let me know if she has recurrent symptoms.  Current medicines are reviewed at length with the patient today.  The patient does not have concerns regarding medicines.  The following changes have been made:  no  change  Labs/ tests ordered today include:   Orders Placed This Encounter  Procedures   LONG TERM MONITOR (3-14 DAYS)   EKG 12-Lead     Disposition:   FU with me as needed based on results of the above   Signed, Minus Breeding, MD  10/28/2021 6:32 PM    [place]

## 2021-10-28 ENCOUNTER — Ambulatory Visit (INDEPENDENT_AMBULATORY_CARE_PROVIDER_SITE_OTHER): Payer: BC Managed Care – PPO

## 2021-10-28 ENCOUNTER — Ambulatory Visit: Payer: BC Managed Care – PPO | Admitting: Cardiology

## 2021-10-28 ENCOUNTER — Encounter: Payer: Self-pay | Admitting: Cardiology

## 2021-10-28 ENCOUNTER — Other Ambulatory Visit: Payer: Self-pay

## 2021-10-28 VITALS — BP 132/74 | HR 71 | Ht 65.0 in | Wt 152.2 lb

## 2021-10-28 DIAGNOSIS — R072 Precordial pain: Secondary | ICD-10-CM | POA: Diagnosis not present

## 2021-10-28 DIAGNOSIS — I493 Ventricular premature depolarization: Secondary | ICD-10-CM

## 2021-10-28 DIAGNOSIS — R002 Palpitations: Secondary | ICD-10-CM

## 2021-10-28 NOTE — Patient Instructions (Signed)
°Testing/Procedures: ° °ZIO XT- Long Term Monitor Instructions ° °Your physician has requested you wear a ZIO patch monitor for 3 days.  °This is a single patch monitor. [employer] supplies one patch monitor per enrollment. Additional °stickers are not available. Please do not apply patch if you will be having a Nuclear Stress Test,  °Echocardiogram, Cardiac CT, MRI, or Chest Xray during the period you would be wearing the  °monitor. The patch cannot be worn during these tests. You cannot remove and re-apply the  °ZIO XT patch monitor.  °Your ZIO patch monitor will be mailed 3 day USPS to your address on file. It may take 3-5 days  °to receive your monitor after you have been enrolled.  °Once you have received your monitor, please review the enclosed instructions. Your monitor  °has already been registered assigning a specific monitor serial # to you. ° °Billing and Patient Assistance Program Information ° °We have supplied [employer] with any of your insurance information on file for billing purposes. °[employer] offers a sliding scale Patient Assistance Program for patients that do not have  °insurance, or whose insurance does not completely cover the cost of the ZIO monitor.  °You must apply for the Patient Assistance Program to qualify for this discounted rate.  °To apply, please call [employer] at [phone], select option 4, select option 2, ask to apply for  °Patient Assistance Program. [employer] will ask your household income, and how many people  °are in your household. They will quote your out-of-pocket cost based on that information.  °[employer] will also be able to set up a 12-month, interest-free payment plan if needed. ° °Applying the monitor °  °Shave hair from upper left chest.  °Hold abrader disc by orange tab. Rub abrader in 40 strokes over the upper left chest as  °indicated in your monitor instructions.  °Clean area with 4 enclosed alcohol pads. Let dry.  °Apply patch as indicated in monitor instructions.  Patch will be placed under collarbone on left  °side of chest with arrow pointing upward.  °Rub patch adhesive wings for 2 minutes. Remove white label marked "1". Remove the white  °label marked "2". Rub patch adhesive wings for 2 additional minutes.  °While looking in a mirror, press and release button in center of patch. A small green light will  °flash 3-4 times. This will be your only indicator that the monitor has been turned on.  °Do not shower for the first 24 hours. You may shower after the first 24 hours.  °Press the button if you feel a symptom. You will hear a small click. Record Date, Time and  °Symptom in the Patient Logbook.  °When you are ready to remove the patch, follow instructions on the last 2 pages of Patient  °Logbook. Stick patch monitor onto the last page of Patient Logbook.  °Place Patient Logbook in the blue and white box. Use locking tab on box and tape box closed  °securely. The blue and white box has prepaid postage on it. Please place it in the mailbox as  °soon as possible. Your physician should have your test results approximately 7 days after the  °monitor has been mailed back to [employer].  °Call [employer] Customer Care at [phone] if you have questions regarding  °your ZIO XT patch monitor. Call them immediately if you see an orange light blinking on your  °monitor.  °If your monitor falls off in less than 4 days, contact our Monitor department at [phone].  °  If your monitor becomes loose or falls off after 4 days call [employer] at [phone] for  suggestions on securing your monitor    Follow-Up: At [facility], you and your health needs are our priority.  As part of our continuing mission to provide you with exceptional heart care, we have created designated Provider Care Teams.  These Care Teams include your primary Cardiologist (physician) and Advanced Practice Providers (APPs -  Physician Assistants and Nurse Practitioners) who all work together to  provide you with the care you need, when you need it.  We recommend signing up for the patient portal called "MyChart".  Sign up information is provided on this After Visit Summary.  MyChart is used to connect with patients for Virtual Visits (Telemedicine).  Patients are able to view lab/test results, encounter notes, upcoming appointments, etc.  Non-urgent messages can be sent to your provider as well.   To learn more about what you can do with MyChart, go to [email or web address].    Your next appointment:    AS NEEDED

## 2021-10-28 NOTE — Progress Notes (Unsigned)
Enrolled for [employer] to mail a ZIO XT long term holter monitor to the patients address on file.  

## 2021-11-01 DIAGNOSIS — R002 Palpitations: Secondary | ICD-10-CM | POA: Diagnosis not present

## 2021-11-12 ENCOUNTER — Other Ambulatory Visit: Payer: Self-pay | Admitting: Radiology

## 2021-11-18 ENCOUNTER — Telehealth: Payer: Self-pay | Admitting: *Deleted

## 2021-11-18 MED ORDER — METOPROLOL SUCCINATE ER 25 MG PO TB24: 25.0000 mg | ORAL_TABLET | Freq: Every day | ORAL | 3 refills | Status: DC

## 2021-11-18 NOTE — Telephone Encounter (Signed)
Spoke with pt, she reports having to take the prn propranolol on a daily basis and is agreeable to try the metoprolol to see if it will control those better. New script sent to the pharmacy. She will call back if she continues to have problems. Results forwarded  ?

## 2021-11-18 NOTE — Telephone Encounter (Signed)
-----   Message from Rollene Rotunda, MD sent at 11/16/2021  8:42 PM EDT ----- ?She has some runs of SVT.  She could increase her beta blocker to metoprolol XL 25 mg PO daily with PRN propranolol or continue the PRN propranolol by itself.  Call Ms. Binz with the results and send results to Georgann Housekeeper, MD ? ?

## 2022-05-05 ENCOUNTER — Other Ambulatory Visit: Payer: Self-pay | Admitting: Neurosurgery

## 2022-06-01 ENCOUNTER — Other Ambulatory Visit (HOSPITAL_COMMUNITY): Payer: BC Managed Care – PPO

## 2022-06-03 ENCOUNTER — Other Ambulatory Visit: Payer: Self-pay | Admitting: Neurosurgery

## 2022-06-22 NOTE — Pre-Procedure Instructions (Signed)
Surgical Instructions    Your procedure is scheduled on July 06, 2022.  Report to [facility] Main Entrance "A" at 5:30 A.M., then check in with the Admitting office.  Call this number if you have problems the morning of surgery:  [phone]   If you have any questions prior to your surgery date call [phone]: Open Monday-Friday 8am-4pm    Remember:  Do not eat or drink after midnight the night before your surgery      Take these medicines the morning of surgery with A SIP OF WATER:  levothyroxine (SYNTHROID, LEVOTHROID)  propranolol (INDERAL)   Mesalamine (ASACOL)  acetaminophen (TYLENOL) - may take as needed    As of today, STOP taking any Aspirin (unless otherwise instructed by your surgeon) Aleve, Naproxen, Ibuprofen, Motrin, Advil, Goody's, BC's, all herbal medications, fish oil, and all vitamins.                     Do NOT Smoke (Tobacco/Vaping) for 24 hours prior to your procedure.  If you use a CPAP at night, you may bring your mask/headgear for your overnight stay.   Contacts, glasses, piercing's, hearing aid's, dentures or partials may not be worn into surgery, please bring cases for these belongings.    For patients admitted to the hospital, discharge time will be determined by your treatment team.   Patients discharged the day of surgery will not be allowed to drive home, and someone needs to stay with them for 24 hours.  SURGICAL WAITING ROOM VISITATION Patients having surgery or a procedure may have no more than 2 support people in the waiting area - these visitors may rotate.   Children under the age of 57 must have an adult with them who is not the patient. If the patient needs to stay at the hospital during part of their recovery, the visitor guidelines for inpatient rooms apply. Pre-op nurse will coordinate an appropriate time for 1 support person to accompany patient in pre-op.  This support person may not rotate.   Please refer to the [facility]  website for the visitor guidelines for Inpatients (after your surgery is over and you are in a regular room).    Special instructions:   [place]- Preparing For Surgery  Before surgery, you can play an important role. Because skin is not sterile, your skin needs to be as free of germs as possible. You can reduce the number of germs on your skin by washing with CHG (chlorahexidine gluconate) Soap before surgery.  CHG is an antiseptic cleaner which kills germs and bonds with the skin to continue killing germs even after washing.    Oral Hygiene is also important to reduce your risk of infection.  Remember - BRUSH YOUR TEETH THE MORNING OF SURGERY WITH YOUR REGULAR TOOTHPASTE  Please do not use if you have an allergy to CHG or antibacterial soaps. If your skin becomes reddened/irritated stop using the CHG.  Do not shave (including legs and underarms) for at least 48 hours prior to first CHG shower. It is OK to shave your face.  Please follow these instructions carefully.   Shower the NIGHT BEFORE SURGERY and the MORNING OF SURGERY  If you chose to wash your hair, wash your hair first as usual with your normal shampoo.  After you shampoo, rinse your hair and body thoroughly to remove the shampoo.  Use CHG Soap as you would any other liquid soap. You can apply CHG directly to the skin  and wash gently with a scrungie or a clean washcloth.   Apply the CHG Soap to your body ONLY FROM THE NECK DOWN.  Do not use on open wounds or open sores. Avoid contact with your eyes, ears, mouth and genitals (private parts). Wash Face and genitals (private parts)  with your normal soap.   Wash thoroughly, paying special attention to the area where your surgery will be performed.  Thoroughly rinse your body with warm water from the neck down.  DO NOT shower/wash with your normal soap after using and rinsing off the CHG Soap.  Pat yourself dry with a CLEAN TOWEL.  Wear CLEAN PAJAMAS to bed the night  before surgery  Place CLEAN SHEETS on your bed the night before your surgery  DO NOT SLEEP WITH PETS.   Day of Surgery: Take a shower with CHG soap. Do not wear jewelry or makeup Do not wear lotions, powders, perfumes/colognes, or deodorant. Do not shave 48 hours prior to surgery.  Men may shave face and neck. Do not bring valuables to the hospital.  [facility] is not responsible for any belongings or valuables. Do not wear nail polish, gel polish, artificial nails, or any other type of covering on natural nails (fingers and toes) If you have artificial nails or gel coating that need to be removed by a nail salon, please have this removed prior to surgery. Artificial nails or gel coating may interfere with anesthesia's ability to adequately monitor your vital signs.  Wear Clean/Comfortable clothing the morning of surgery Remember to brush your teeth WITH YOUR REGULAR TOOTHPASTE.   Please read over the following fact sheets that you were given.    If you received a COVID test during your pre-op visit  it is requested that you wear a mask when out in public, stay away from anyone that may not be feeling well and notify your surgeon if you develop symptoms. If you have been in contact with anyone that has tested positive in the last 10 days please notify you surgeon.

## 2022-06-23 ENCOUNTER — Other Ambulatory Visit: Payer: Self-pay

## 2022-06-23 ENCOUNTER — Encounter (HOSPITAL_COMMUNITY): Payer: Self-pay | Admitting: *Deleted

## 2022-06-23 ENCOUNTER — Encounter (HOSPITAL_COMMUNITY)
Admission: RE | Admit: 2022-06-23 | Discharge: 2022-06-23 | Disposition: A | Discharge status: Home / Self Care | Payer: BC Managed Care – PPO | Source: Ambulatory Visit | Attending: Neurosurgery | Admitting: Neurosurgery

## 2022-06-23 VITALS — BP 126/65 | HR 87 | Temp 97.6°F | Resp 18 | Ht 65.0 in | Wt 152.0 lb

## 2022-06-23 DIAGNOSIS — Z01818 Encounter for other preprocedural examination: Secondary | ICD-10-CM

## 2022-06-23 DIAGNOSIS — Z01812 Encounter for preprocedural laboratory examination: Secondary | ICD-10-CM | POA: Diagnosis present

## 2022-06-23 HISTORY — DX: Supraventricular tachycardia, unspecified: I47.10

## 2022-06-23 LAB — TYPE AND SCREEN
ABO/RH(D): A POS
Antibody Screen: NEGATIVE

## 2022-06-23 LAB — BASIC METABOLIC PANEL
Anion gap: 10 (ref 5–15)
BUN: 12 mg/dL (ref 6–20)
CO2: 27 mmol/L (ref 22–32)
Calcium: 10.1 mg/dL (ref 8.9–10.3)
Chloride: 102 mmol/L (ref 98–111)
Creatinine, Ser: 0.51 mg/dL (ref 0.44–1.00)
GFR, Estimated: >60 mL/min (ref >60)
Glucose, Bld: 108 mg/dL — ABNORMAL HIGH (ref 70–99)
Potassium: 4.3 mmol/L (ref 3.5–5.1)
Sodium: 139 mmol/L (ref 135–145)

## 2022-06-23 LAB — CBC
HCT: 41.8 % (ref 36.0–46.0)
Hemoglobin: 13.9 g/dL (ref 12.0–15.0)
MCH: 31.5 pg (ref 26.0–34.0)
MCHC: 33.3 g/dL (ref 30.0–36.0)
MCV: 94.8 fL (ref 80.0–100.0)
Platelets: 262 10*3/uL (ref 150–400)
RBC: 4.41 MIL/uL (ref 3.87–5.11)
RDW: 12.5 % (ref 11.5–15.5)
WBC: 6.6 10*3/uL (ref 4.0–10.5)
nRBC: 0 % (ref 0.0–0.2)

## 2022-06-23 LAB — SURGICAL PCR SCREEN
MRSA, PCR: NEGATIVE
Staphylococcus aureus: NEGATIVE

## 2022-06-23 NOTE — Progress Notes (Signed)
PCP - Dr.Karrar Lysle Rubens Cardiologist - Dr.James Hochrein  PPM/ICD - denies Device Orders - n/a Rep Notified -n/a   Chest x-ray - n/a EKG - 10/28/21 Stress Test - 2016 ECHO - 03/24/2017 Cardiac Cath - denies  Sleep Study - denies CPAP - n/a  DM-pt denies  Blood Thinner Instructions:denies Aspirin Instructions:denies  NPO after MN  COVID TEST- n/a   Anesthesia review: YES, cardiac history. SVT. Cardio OV 10/28/21.   Patient denies shortness of breath, fever, cough and chest pain at PAT appointment   All instructions explained to the patient, with a verbal understanding of the material. Patient agrees to go over the instructions while at home for a better understanding. Patient also instructed to self quarantine after being tested for COVID-19. The opportunity to ask questions was provided.

## 2022-07-05 ENCOUNTER — Ambulatory Visit (HOSPITAL_COMMUNITY): Payer: Self-pay

## 2022-07-05 ENCOUNTER — Ambulatory Visit
Admission: RE | Admit: 2022-07-05 | Discharge: 2022-07-05 | Disposition: A | Discharge status: Home / Self Care | Payer: BC Managed Care – PPO | Source: Ambulatory Visit | Attending: Urgent Care | Admitting: Urgent Care

## 2022-07-05 VITALS — BP 131/81 | HR 95 | Temp 97.9°F | Resp 16

## 2022-07-05 DIAGNOSIS — R300 Dysuria: Secondary | ICD-10-CM | POA: Diagnosis present

## 2022-07-05 DIAGNOSIS — R350 Frequency of micturition: Secondary | ICD-10-CM | POA: Insufficient documentation

## 2022-07-05 HISTORY — DX: Dorsalgia, unspecified: M54.9

## 2022-07-05 LAB — POCT URINALYSIS DIP (MANUAL ENTRY)
Bilirubin, UA: NEGATIVE
Glucose, UA: NEGATIVE mg/dL
Nitrite, UA: NEGATIVE
Protein Ur, POC: NEGATIVE mg/dL
Spec Grav, UA: 1.015 (ref 1.010–1.025)
Urobilinogen, UA: 0.2 E.U./dL
pH, UA: 6.5 (ref 5.0–8.0)

## 2022-07-05 NOTE — ED Provider Notes (Signed)
[facility] - URGENT [facility]  Note:  This document was prepared using Conservation officer, historic buildings and may include unintentional dictation errors.  MRN: [id number] DOB: 25-Aug-1966  Subjective:   Alexandra Lee is a 56 y.o. female presenting for 3 day history of acute onset urinary frequency, mild occasional dysuria.  Has had pelvic pressure especially when she is urinating or leaning over to help provide relief for her back pain.  No fever, hematuria, vaginal discharge, abdominal or pelvic pain.  Patient hydrates with about 32 to 40 ounces of water daily.  Of particular concern to her is that she is supposed to be tested for UTI today because she is going to have back surgery tomorrow.  Was advised that she needs to be checked prior to her surgery.  No current facility-administered medications for this encounter.  Current Outpatient Medications:    acetaminophen (TYLENOL) 325 MG tablet, Take 650 mg by mouth every 6 (six) hours as needed for moderate pain., Disp: , Rfl:    bisacodyl 5 MG EC tablet, Take 5 mg by mouth daily as needed for moderate constipation., Disp: , Rfl:    levothyroxine (SYNTHROID, LEVOTHROID) 50 MCG tablet, Take 50 mcg by mouth daily before breakfast., Disp: , Rfl: 0   LORazepam (ATIVAN) 1 MG tablet, Take 1 mg by mouth at bedtime., Disp: , Rfl:    Mesalamine (ASACOL) 400 MG CPDR DR capsule, Take 400-800 mg by mouth See admin instructions. Take 800 mg in the morning and 400 mg in the evening, may take and additional 400-800 mg if needed., Disp: , Rfl:    mesalamine (CANASA) 1000 MG suppository, Place 1,000 mg rectally at bedtime., Disp: , Rfl:    polyethylene glycol (MIRALAX / GLYCOLAX) 17 g packet, Take 17 g by mouth daily., Disp: , Rfl:    propranolol (INDERAL) 10 MG tablet, Take 5 mg by mouth daily., Disp: , Rfl:    Ca Phosphate-Cholecalciferol (CALTRATE GUMMY BITES) 250-10 MG-MCG CHEW, Chew 1 tablet by mouth every other day., Disp: , Rfl:    metoprolol  succinate (TOPROL XL) 25 MG 24 hr tablet, Take 1 tablet (25 mg total) by mouth daily. (Patient not taking: Reported on 06/18/2022), Disp: 90 tablet, Rfl: 3   Multiple Vitamin (MULTIVITAMIN WITH MINERALS) TABS tablet, Take 1 tablet by mouth daily., Disp: , Rfl:    Phenazopyridine HCl (AZO TABS PO), Take 1 tablet by mouth daily., Disp: , Rfl:    Allergies  Allergen Reactions   Doxycycline Diarrhea and Nausea And Vomiting   Ibuprofen Other (See Comments)    interacts with ulcerative colitis medications.   Latex Other (See Comments)    Skin reaction with prolonged contact (i.e. Bandages/sutures)    Past Medical History:  Diagnosis Date   Anemia    Anxiety    Back pain    Blood dyscrasia    GERD (gastroesophageal reflux disease)    tums after certain foods   Headache    occ   Hypothyroidism    PVC (premature ventricular contraction) no meds    increase pulse occasionally    SVT (supraventricular tachycardia)    UC (ulcerative colitis) (HCC)      Past Surgical History:  Procedure Laterality Date   CESAREAN SECTION     x 2   COLONOSCOPY     multiple   DILATATION & CURETTAGE/HYSTEROSCOPY WITH MYOSURE N/A 02/26/2017   Procedure: DILATATION & CURETTAGE/HYSTEROSCOPY WITH MYOSURE;  Surgeon: Olivia Mackie, MD;  Location: [facility];  Service: Gynecology;  Laterality: N/A;   DILATION AND CURETTAGE OF UTERUS     2007   RECONSTRUCTION OF NOSE     UPPER GI ENDOSCOPY      Family History  Problem Relation Age of Onset   Atrial fibrillation Mother    Pulmonary embolism Father    GER disease Sister    Other Sister        brain tumor   Hypothyroidism Sister    Hypothyroidism Sister     Social History   Tobacco Use   Smoking status: Never   Smokeless tobacco: Never  Vaping Use   Vaping Use: Never used  Substance Use Topics   Alcohol use: No   Drug use: No    ROS   Objective:   Vitals: BP 131/81   Pulse 95   Temp 97.9 F (36.6 C) (Oral)   Resp 16   LMP 02/13/2017  (Exact Date)   SpO2 99%   Physical Exam Constitutional:      General: She is not in acute distress.    Appearance: Normal appearance. She is well-developed. She is not ill-appearing, toxic-appearing or diaphoretic.  HENT:     Head: Normocephalic and atraumatic.     Nose: Nose normal.     Mouth/Throat:     Mouth: Mucous membranes are moist.  Eyes:     General: No scleral icterus.       Right eye: No discharge.        Left eye: No discharge.     Extraocular Movements: Extraocular movements intact.  Cardiovascular:     Rate and Rhythm: Normal rate.  Pulmonary:     Effort: Pulmonary effort is normal.  Skin:    General: Skin is warm and dry.  Neurological:     General: No focal deficit present.     Mental Status: She is alert and oriented to person, place, and time.  Psychiatric:        Mood and Affect: Mood normal.        Behavior: Behavior normal.     Results for orders placed or performed during the hospital encounter of 07/05/22 (from the past 24 hour(s))  POCT urinalysis dipstick     Status: Abnormal   Collection Time: 07/05/22  9:05 AM  Result Value Ref Range   Color, UA yellow yellow   Clarity, UA clear clear   Glucose, UA negative negative mg/dL   Bilirubin, UA negative negative   Ketones, POC UA small (15) (A) negative mg/dL   Spec Grav, UA 1.015 1.010 - 1.025   Blood, UA small (A) negative   pH, UA 6.5 5.0 - 8.0   Protein Ur, POC negative negative mg/dL   Urobilinogen, UA 0.2 0.2 or 1.0 E.U./dL   Nitrite, UA Negative Negative   Leukocytes, UA Trace (A) Negative    Assessment and Plan :   PDMP not reviewed this encounter.  1. Urinary frequency   2. Dysuria     Recommended holding off on antibiotics which the patient was hesitant to do.  Ultimately she decided to await urine culture results instead of filling a prescription for Keflex to treat empirically for an UTI.  Otherwise discussed extensively that the urinalysis did not support acute cystitis.   Patient is to hydrate much better with 64 to 80 ounces daily. Counseled patient on potential for adverse effects with medications prescribed/recommended today, ER and return-to-clinic precautions discussed, patient verbalized understanding.    Jaynee Eagles, [place] 07/05/22 [phone]

## 2022-07-05 NOTE — Anesthesia Preprocedure Evaluation (Signed)
Anesthesia Evaluation  Patient identified by MRN, date of birth, ID band Patient awake    Reviewed: Allergy & Precautions, NPO status , Patient's Chart, lab work & pertinent test results, reviewed documented beta blocker date and time   Airway Mallampati: II  TM Distance: >3 FB Neck ROM: Full    Dental  (+) Teeth Intact, Dental Advisory Given   Pulmonary neg pulmonary ROS,    Pulmonary exam normal breath sounds clear to auscultation       Cardiovascular (-) anginaNormal cardiovascular exam+ dysrhythmias (Propanolol) Supra Ventricular Tachycardia  Rhythm:Regular Rate:Normal     Neuro/Psych  Headaches, PSYCHIATRIC DISORDERS Anxiety SPONDYLOLISTHESIS, LUMBAR REGION    GI/Hepatic Neg liver ROS, PUD, GERD  ,UC   Endo/Other  Hypothyroidism   Renal/GU negative Renal ROS     Musculoskeletal negative musculoskeletal ROS (+)   Abdominal   Peds  Hematology negative hematology ROS (+)   Anesthesia Other Findings   Reproductive/Obstetrics                            Anesthesia Physical Anesthesia Plan  ASA: 2  Anesthesia Plan: General   Post-op Pain Management: Tylenol PO (pre-op)*   Induction: Intravenous  PONV Risk Score and Plan: 3 and Midazolam, Dexamethasone and Ondansetron  Airway Management Planned: Oral ETT  Additional Equipment:   Intra-op Plan:   Post-operative Plan: Extubation in OR  Informed Consent: I have reviewed the patients History and Physical, chart, labs and discussed the procedure including the risks, benefits and alternatives for the proposed anesthesia with the patient or authorized representative who has indicated his/her understanding and acceptance.     Dental advisory given  Plan Discussed with: CRNA  Anesthesia Plan Comments:        Anesthesia Quick Evaluation

## 2022-07-05 NOTE — Discharge Instructions (Addendum)
You have trace leukocytes, negative nitrites which does not suggest you have an urinary tract infection. We will be ordering an urine culture which takes 3-4 days to result. This will be a definitive way to prove if there is an UTI but your urinalysis in clinic does not show that.   Make sure you hydrate very well with plain water and a quantity of 64-80 ounces of water a day.  Please limit drinks that are considered urinary irritants such as soda, sweet tea, coffee, energy drinks, alcohol.  These can worsen your urinary and genital symptoms but also be the source of them.  I will let you know about your urine culture results through MyChart to see if we need to prescribe or change your antibiotics based off of those results.

## 2022-07-05 NOTE — ED Triage Notes (Addendum)
C/O dysuria and polyuria onset 2 days ago; states occasionally gets bladder pressure with her back issue, but started having pain in groin area also. Surgeon wants her to make sure she doesn't have UTI prior to surgery -- pt scheduled for spinal fusion tomorrow. Denies fevers.

## 2022-07-06 ENCOUNTER — Other Ambulatory Visit: Payer: Self-pay

## 2022-07-06 ENCOUNTER — Ambulatory Visit (HOSPITAL_COMMUNITY): Payer: BC Managed Care – PPO | Admitting: Vascular Surgery

## 2022-07-06 ENCOUNTER — Ambulatory Visit (HOSPITAL_COMMUNITY): Payer: BC Managed Care – PPO

## 2022-07-06 ENCOUNTER — Encounter (HOSPITAL_COMMUNITY)
Admission: RE | Disposition: A | Discharge status: Home / Self Care | Payer: Self-pay | Source: Home / Self Care | Attending: Neurosurgery

## 2022-07-06 ENCOUNTER — Encounter (HOSPITAL_COMMUNITY): Payer: Self-pay | Admitting: Neurosurgery

## 2022-07-06 ENCOUNTER — Ambulatory Visit (HOSPITAL_COMMUNITY)
Admission: RE | Admit: 2022-07-06 | Discharge: 2022-07-07 | Disposition: A | Discharge status: Home / Self Care | Payer: BC Managed Care – PPO | Attending: Neurosurgery | Admitting: Neurosurgery

## 2022-07-06 DIAGNOSIS — K219 Gastro-esophageal reflux disease without esophagitis: Secondary | ICD-10-CM | POA: Insufficient documentation

## 2022-07-06 DIAGNOSIS — E039 Hypothyroidism, unspecified: Secondary | ICD-10-CM | POA: Insufficient documentation

## 2022-07-06 DIAGNOSIS — M5116 Intervertebral disc disorders with radiculopathy, lumbar region: Secondary | ICD-10-CM | POA: Insufficient documentation

## 2022-07-06 DIAGNOSIS — F419 Anxiety disorder, unspecified: Secondary | ICD-10-CM | POA: Diagnosis not present

## 2022-07-06 DIAGNOSIS — M4726 Other spondylosis with radiculopathy, lumbar region: Secondary | ICD-10-CM | POA: Diagnosis not present

## 2022-07-06 DIAGNOSIS — M4316 Spondylolisthesis, lumbar region: Secondary | ICD-10-CM | POA: Diagnosis present

## 2022-07-06 DIAGNOSIS — M48062 Spinal stenosis, lumbar region with neurogenic claudication: Secondary | ICD-10-CM | POA: Insufficient documentation

## 2022-07-06 DIAGNOSIS — I472 Ventricular tachycardia, unspecified: Secondary | ICD-10-CM | POA: Insufficient documentation

## 2022-07-06 DIAGNOSIS — R519 Headache, unspecified: Secondary | ICD-10-CM | POA: Insufficient documentation

## 2022-07-06 DIAGNOSIS — K279 Peptic ulcer, site unspecified, unspecified as acute or chronic, without hemorrhage or perforation: Secondary | ICD-10-CM | POA: Diagnosis not present

## 2022-07-06 LAB — ABO/RH: ABO/RH(D): A POS

## 2022-07-06 LAB — URINE CULTURE: Culture: <10000 — AB

## 2022-07-06 SURGERY — POSTERIOR LUMBAR FUSION 1 LEVEL
Anesthesia: General

## 2022-07-06 MED ORDER — SODIUM CHLORIDE 0.9 % IV SOLN
250.0000 mL | INTRAVENOUS | Status: DC
  Administered 2022-07-06: 250 mL via INTRAVENOUS

## 2022-07-06 MED ORDER — MESALAMINE 1000 MG RE SUPP
1000.0000 mg | Freq: Every day | RECTAL | Status: DC
  Administered 2022-07-06: 1000 mg via RECTAL
  Filled 2022-07-06 (×2): qty 1

## 2022-07-06 MED ORDER — MIDAZOLAM HCL 2 MG/2ML IJ SOLN
INTRAMUSCULAR | Status: AC
  Filled 2022-07-06: qty 2

## 2022-07-06 MED ORDER — ONDANSETRON HCL 4 MG/2ML IJ SOLN
INTRAMUSCULAR | Status: AC
  Filled 2022-07-06: qty 2

## 2022-07-06 MED ORDER — ORAL CARE MOUTH RINSE: 15.0000 mL | Freq: Once | OROMUCOSAL | Status: AC

## 2022-07-06 MED ORDER — CHLORHEXIDINE GLUCONATE CLOTH 2 % EX PADS: 6.0000 | MEDICATED_PAD | Freq: Once | CUTANEOUS | Status: DC

## 2022-07-06 MED ORDER — ACETAMINOPHEN 325 MG PO TABS: 650.0000 mg | ORAL_TABLET | ORAL | Status: DC | PRN

## 2022-07-06 MED ORDER — PHENYLEPHRINE 80 MCG/ML (10ML) SYRINGE FOR IV PUSH (FOR BLOOD PRESSURE SUPPORT)
PREFILLED_SYRINGE | INTRAVENOUS | Status: AC
  Filled 2022-07-06: qty 10

## 2022-07-06 MED ORDER — ONDANSETRON HCL 4 MG/2ML IJ SOLN
INTRAMUSCULAR | Status: DC | PRN
  Administered 2022-07-06: 4 mg via INTRAVENOUS

## 2022-07-06 MED ORDER — SODIUM CHLORIDE 0.9% FLUSH
3.0000 mL | Freq: Two times a day (BID) | INTRAVENOUS | Status: DC
  Administered 2022-07-06 (×2): 3 mL via INTRAVENOUS

## 2022-07-06 MED ORDER — LORAZEPAM 0.5 MG PO TABS
1.0000 mg | ORAL_TABLET | Freq: Every day | ORAL | Status: DC
  Administered 2022-07-06: 1 mg via ORAL
  Filled 2022-07-06: qty 2

## 2022-07-06 MED ORDER — BUPIVACAINE LIPOSOME 1.3 % IJ SUSP
INTRAMUSCULAR | Status: DC | PRN
  Administered 2022-07-06: 20 mL

## 2022-07-06 MED ORDER — MESALAMINE 400 MG PO CPDR
400.0000 mg | DELAYED_RELEASE_CAPSULE | Freq: Every day | ORAL | Status: DC
  Administered 2022-07-06: 400 mg via ORAL
  Filled 2022-07-06 (×2): qty 1

## 2022-07-06 MED ORDER — ACETAMINOPHEN 500 MG PO TABS
1000.0000 mg | ORAL_TABLET | Freq: Four times a day (QID) | ORAL | Status: AC
  Administered 2022-07-06 – 2022-07-07 (×3): 1000 mg via ORAL
  Filled 2022-07-06 (×3): qty 2

## 2022-07-06 MED ORDER — BISACODYL 10 MG RE SUPP: 10.0000 mg | Freq: Every day | RECTAL | Status: DC | PRN

## 2022-07-06 MED ORDER — CHLORHEXIDINE GLUCONATE 0.12 % MT SOLN
15.0000 mL | Freq: Once | OROMUCOSAL | Status: AC
  Administered 2022-07-06: 15 mL via OROMUCOSAL
  Filled 2022-07-06: qty 15

## 2022-07-06 MED ORDER — PHENOL 1.4 % MT LIQD: 1.0000 | OROMUCOSAL | Status: DC | PRN

## 2022-07-06 MED ORDER — PROMETHAZINE HCL 25 MG/ML IJ SOLN
INTRAMUSCULAR | Status: AC
  Filled 2022-07-06: qty 1

## 2022-07-06 MED ORDER — MIDAZOLAM HCL 2 MG/2ML IJ SOLN
INTRAMUSCULAR | Status: DC | PRN
  Administered 2022-07-06: 2 mg via INTRAVENOUS

## 2022-07-06 MED ORDER — MESALAMINE 400 MG PO CPDR: 400.0000 mg | DELAYED_RELEASE_CAPSULE | Freq: Every day | ORAL | Status: DC | PRN

## 2022-07-06 MED ORDER — ONDANSETRON HCL 4 MG PO TABS: 4.0000 mg | ORAL_TABLET | Freq: Four times a day (QID) | ORAL | Status: DC | PRN

## 2022-07-06 MED ORDER — HYDROXYZINE HCL 50 MG/ML IM SOLN: 50.0000 mg | Freq: Four times a day (QID) | INTRAMUSCULAR | Status: DC | PRN

## 2022-07-06 MED ORDER — FENTANYL CITRATE (PF) 250 MCG/5ML IJ SOLN
INTRAMUSCULAR | Status: AC
  Filled 2022-07-06: qty 5

## 2022-07-06 MED ORDER — SUCCINYLCHOLINE CHLORIDE 200 MG/10ML IV SOSY
PREFILLED_SYRINGE | INTRAVENOUS | Status: AC
  Filled 2022-07-06: qty 10

## 2022-07-06 MED ORDER — ONDANSETRON HCL 4 MG/2ML IJ SOLN: 4.0000 mg | Freq: Four times a day (QID) | INTRAMUSCULAR | Status: DC | PRN

## 2022-07-06 MED ORDER — MORPHINE SULFATE (PF) 4 MG/ML IV SOLN: 4.0000 mg | INTRAVENOUS | Status: DC | PRN

## 2022-07-06 MED ORDER — FENTANYL CITRATE (PF) 100 MCG/2ML IJ SOLN
25.0000 ug | INTRAMUSCULAR | Status: DC | PRN
  Administered 2022-07-06: 50 ug via INTRAVENOUS

## 2022-07-06 MED ORDER — ACETAMINOPHEN 650 MG RE SUPP: 650.0000 mg | RECTAL | Status: DC | PRN

## 2022-07-06 MED ORDER — SUGAMMADEX SODIUM 200 MG/2ML IV SOLN
INTRAVENOUS | Status: DC | PRN
  Administered 2022-07-06: 200 mg via INTRAVENOUS

## 2022-07-06 MED ORDER — LACTATED RINGERS IV SOLN: INTRAVENOUS | Status: DC

## 2022-07-06 MED ORDER — PROMETHAZINE HCL 25 MG/ML IJ SOLN
6.2500 mg | INTRAMUSCULAR | Status: DC | PRN
  Administered 2022-07-06: 6.25 mg via INTRAVENOUS

## 2022-07-06 MED ORDER — CEFAZOLIN SODIUM-DEXTROSE 2-4 GM/100ML-% IV SOLN
2.0000 g | INTRAVENOUS | Status: AC
  Administered 2022-07-06: 2 g via INTRAVENOUS
  Filled 2022-07-06: qty 100

## 2022-07-06 MED ORDER — LIDOCAINE 2% (20 MG/ML) 5 ML SYRINGE
INTRAMUSCULAR | Status: AC
  Filled 2022-07-06: qty 5

## 2022-07-06 MED ORDER — ROCURONIUM BROMIDE 10 MG/ML (PF) SYRINGE
PREFILLED_SYRINGE | INTRAVENOUS | Status: DC | PRN
  Administered 2022-07-06: 10 mg via INTRAVENOUS
  Administered 2022-07-06: 5 mg via INTRAVENOUS
  Administered 2022-07-06: 60 mg via INTRAVENOUS
  Administered 2022-07-06 (×2): 20 mg via INTRAVENOUS

## 2022-07-06 MED ORDER — KETAMINE HCL 50 MG/5ML IJ SOSY
PREFILLED_SYRINGE | INTRAMUSCULAR | Status: AC
  Filled 2022-07-06: qty 5

## 2022-07-06 MED ORDER — BACITRACIN ZINC 500 UNIT/GM EX OINT
TOPICAL_OINTMENT | CUTANEOUS | Status: DC | PRN
  Administered 2022-07-06: 1 via TOPICAL

## 2022-07-06 MED ORDER — PROPOFOL 10 MG/ML IV BOLUS
INTRAVENOUS | Status: DC | PRN
  Administered 2022-07-06: 140 mg via INTRAVENOUS

## 2022-07-06 MED ORDER — DEXAMETHASONE SODIUM PHOSPHATE 10 MG/ML IJ SOLN
INTRAMUSCULAR | Status: DC | PRN
  Administered 2022-07-06: 10 mg via INTRAVENOUS

## 2022-07-06 MED ORDER — OXYCODONE HCL 5 MG PO TABS: 10.0000 mg | ORAL_TABLET | ORAL | Status: DC | PRN

## 2022-07-06 MED ORDER — POLYETHYLENE GLYCOL 3350 17 G PO PACK: 17.0000 g | PACK | Freq: Every day | ORAL | Status: DC

## 2022-07-06 MED ORDER — ROCURONIUM BROMIDE 10 MG/ML (PF) SYRINGE
PREFILLED_SYRINGE | INTRAVENOUS | Status: AC
  Filled 2022-07-06: qty 10

## 2022-07-06 MED ORDER — PROPOFOL 10 MG/ML IV BOLUS
INTRAVENOUS | Status: AC
  Filled 2022-07-06: qty 20

## 2022-07-06 MED ORDER — PROPRANOLOL HCL 10 MG PO TABS
5.0000 mg | ORAL_TABLET | Freq: Every day | ORAL | Status: DC
  Filled 2022-07-06 (×3): qty 0.5

## 2022-07-06 MED ORDER — MENTHOL 3 MG MT LOZG: 1.0000 | LOZENGE | OROMUCOSAL | Status: DC | PRN

## 2022-07-06 MED ORDER — CYCLOBENZAPRINE HCL 10 MG PO TABS
10.0000 mg | ORAL_TABLET | Freq: Three times a day (TID) | ORAL | Status: DC | PRN
  Administered 2022-07-06 – 2022-07-07 (×2): 10 mg via ORAL
  Filled 2022-07-06 (×3): qty 1

## 2022-07-06 MED ORDER — DOCUSATE SODIUM 100 MG PO CAPS
100.0000 mg | ORAL_CAPSULE | Freq: Two times a day (BID) | ORAL | Status: DC
  Administered 2022-07-06: 100 mg via ORAL
  Filled 2022-07-06 (×2): qty 1

## 2022-07-06 MED ORDER — MESALAMINE 400 MG PO CPDR
800.0000 mg | DELAYED_RELEASE_CAPSULE | Freq: Every day | ORAL | Status: DC
  Administered 2022-07-07: 800 mg via ORAL
  Filled 2022-07-06: qty 2

## 2022-07-06 MED ORDER — 0.9 % SODIUM CHLORIDE (POUR BTL) OPTIME
TOPICAL | Status: DC | PRN
  Administered 2022-07-06: 1000 mL

## 2022-07-06 MED ORDER — BUPIVACAINE LIPOSOME 1.3 % IJ SUSP
INTRAMUSCULAR | Status: AC
  Filled 2022-07-06: qty 20

## 2022-07-06 MED ORDER — KETAMINE HCL 10 MG/ML IJ SOLN
INTRAMUSCULAR | Status: DC | PRN
  Administered 2022-07-06: 15 mg via INTRAVENOUS
  Administered 2022-07-06: 35 mg via INTRAVENOUS

## 2022-07-06 MED ORDER — ZOLPIDEM TARTRATE 5 MG PO TABS: 5.0000 mg | ORAL_TABLET | Freq: Every evening | ORAL | Status: DC | PRN

## 2022-07-06 MED ORDER — EPHEDRINE 5 MG/ML INJ
INTRAVENOUS | Status: AC
  Filled 2022-07-06: qty 5

## 2022-07-06 MED ORDER — ACETAMINOPHEN 500 MG PO TABS
1000.0000 mg | ORAL_TABLET | Freq: Once | ORAL | Status: AC
  Administered 2022-07-06: 1000 mg via ORAL
  Filled 2022-07-06: qty 2

## 2022-07-06 MED ORDER — OXYCODONE HCL 5 MG PO TABS
5.0000 mg | ORAL_TABLET | ORAL | Status: DC | PRN
  Administered 2022-07-06 – 2022-07-07 (×3): 5 mg via ORAL
  Filled 2022-07-06 (×3): qty 1

## 2022-07-06 MED ORDER — CEFAZOLIN SODIUM-DEXTROSE 2-4 GM/100ML-% IV SOLN
2.0000 g | Freq: Three times a day (TID) | INTRAVENOUS | Status: AC
  Administered 2022-07-06 (×2): 2 g via INTRAVENOUS
  Filled 2022-07-06 (×2): qty 100

## 2022-07-06 MED ORDER — PHENYLEPHRINE 80 MCG/ML (10ML) SYRINGE FOR IV PUSH (FOR BLOOD PRESSURE SUPPORT)
PREFILLED_SYRINGE | INTRAVENOUS | Status: DC | PRN
  Administered 2022-07-06 (×4): 160 ug via INTRAVENOUS

## 2022-07-06 MED ORDER — LIDOCAINE 2% (20 MG/ML) 5 ML SYRINGE
INTRAMUSCULAR | Status: DC | PRN
  Administered 2022-07-06: 80 mg via INTRAVENOUS

## 2022-07-06 MED ORDER — FENTANYL CITRATE (PF) 250 MCG/5ML IJ SOLN
INTRAMUSCULAR | Status: DC | PRN
  Administered 2022-07-06: 100 ug via INTRAVENOUS

## 2022-07-06 MED ORDER — BACITRACIN ZINC 500 UNIT/GM EX OINT
TOPICAL_OINTMENT | CUTANEOUS | Status: AC
  Filled 2022-07-06: qty 28.35

## 2022-07-06 MED ORDER — BUPIVACAINE-EPINEPHRINE (PF) 0.5% -1:200000 IJ SOLN
INTRAMUSCULAR | Status: AC
  Filled 2022-07-06: qty 30

## 2022-07-06 MED ORDER — THROMBIN 5000 UNITS EX SOLR
CUTANEOUS | Status: AC
  Filled 2022-07-06: qty 5000

## 2022-07-06 MED ORDER — BUPIVACAINE-EPINEPHRINE (PF) 0.5% -1:200000 IJ SOLN
INTRAMUSCULAR | Status: DC | PRN
  Administered 2022-07-06: 10 mL via PERINEURAL

## 2022-07-06 MED ORDER — SODIUM CHLORIDE 0.9% FLUSH: 3.0000 mL | INTRAVENOUS | Status: DC | PRN

## 2022-07-06 MED ORDER — THROMBIN 5000 UNITS EX SOLR
OROMUCOSAL | Status: DC | PRN
  Administered 2022-07-06: 5 mL via TOPICAL

## 2022-07-06 MED ORDER — FENTANYL CITRATE (PF) 100 MCG/2ML IJ SOLN
INTRAMUSCULAR | Status: AC
  Filled 2022-07-06: qty 2

## 2022-07-06 MED ORDER — LEVOTHYROXINE SODIUM 25 MCG PO TABS
50.0000 ug | ORAL_TABLET | Freq: Every day | ORAL | Status: DC
  Administered 2022-07-07: 50 ug via ORAL
  Filled 2022-07-06: qty 2

## 2022-07-06 MED ORDER — DEXAMETHASONE SODIUM PHOSPHATE 10 MG/ML IJ SOLN
INTRAMUSCULAR | Status: AC
  Filled 2022-07-06: qty 1

## 2022-07-06 MED ORDER — PHENAZOPYRIDINE HCL 100 MG PO TABS
100.0000 mg | ORAL_TABLET | Freq: Three times a day (TID) | ORAL | Status: DC
  Filled 2022-07-06 (×4): qty 1

## 2022-07-06 MED ORDER — EPHEDRINE SULFATE-NACL 50-0.9 MG/10ML-% IV SOSY
PREFILLED_SYRINGE | INTRAVENOUS | Status: DC | PRN
  Administered 2022-07-06: 5 mg via INTRAVENOUS
  Administered 2022-07-06: 10 mg via INTRAVENOUS

## 2022-07-06 MED ORDER — PHENYLEPHRINE HCL-NACL 20-0.9 MG/250ML-% IV SOLN
INTRAVENOUS | Status: DC | PRN
  Administered 2022-07-06: 40 ug/min via INTRAVENOUS

## 2022-07-06 SURGICAL SUPPLY — 64 items
BAG COUNTER SPONGE SURGICOUNT (BAG) ×1 IMPLANT
BASKET BONE COLLECTION (BASKET) ×1 IMPLANT
BENZOIN TINCTURE PRP APPL 2/3 (GAUZE/BANDAGES/DRESSINGS) ×1 IMPLANT
BLADE CLIPPER SURG (BLADE) IMPLANT
BUR MATCHSTICK NEURO 3.0 LAGG (BURR) ×1 IMPLANT
BUR PRECISION FLUTE 6.0 (BURR) ×1 IMPLANT
CAGE ALTERA 10X31X9-13 15D (Cage) IMPLANT
CANISTER SUCT 3000ML PPV (MISCELLANEOUS) ×1 IMPLANT
CAP LOCK DLX THRD (Cap) IMPLANT
CLIP GRAFTMAG DISP 742X3.25 (NEUROSURGERY SUPPLIES) IMPLANT
CNTNR URN SCR LID CUP LEK RST (MISCELLANEOUS) ×1 IMPLANT
CONT SPEC 4OZ STRL OR WHT (MISCELLANEOUS) ×1
COVER BACK TABLE 60X90IN (DRAPES) ×1 IMPLANT
DRAPE C-ARM 42X72 X-RAY (DRAPES) ×2 IMPLANT
DRAPE HALF SHEET 40X57 (DRAPES) ×1 IMPLANT
DRAPE LAPAROTOMY 100X72X124 (DRAPES) ×1 IMPLANT
DRAPE SURG 17X23 STRL (DRAPES) ×4 IMPLANT
DRSG OPSITE POSTOP 4X6 (GAUZE/BANDAGES/DRESSINGS) ×1 IMPLANT
ELECT BLADE 4.0 EZ CLEAN MEGAD (MISCELLANEOUS) ×1
ELECT REM PT RETURN 9FT ADLT (ELECTROSURGICAL) ×1
ELECTRODE BLDE 4.0 EZ CLN MEGD (MISCELLANEOUS) ×1 IMPLANT
ELECTRODE REM PT RTRN 9FT ADLT (ELECTROSURGICAL) ×1 IMPLANT
EVACUATOR 1/8 PVC DRAIN (DRAIN) IMPLANT
GAUZE 4X4 16PLY ~~LOC~~+RFID DBL (SPONGE) ×1 IMPLANT
GLOVE BIO SURGEON STRL SZ 6 (GLOVE) ×1 IMPLANT
GLOVE BIO SURGEON STRL SZ8 (GLOVE) ×2 IMPLANT
GLOVE BIO SURGEON STRL SZ8.5 (GLOVE) ×2 IMPLANT
GLOVE BIOGEL PI IND STRL 6.5 (GLOVE) ×1 IMPLANT
GLOVE BIOGEL PI IND STRL 7.0 (GLOVE) IMPLANT
GLOVE ECLIPSE 6.5 STRL STRAW (GLOVE) IMPLANT
GLOVE ECLIPSE 7.0 STRL STRAW (GLOVE) IMPLANT
GLOVE EXAM NITRILE XL STR (GLOVE) IMPLANT
GOWN STRL REUS W/ TWL LRG LVL3 (GOWN DISPOSABLE) ×1 IMPLANT
GOWN STRL REUS W/ TWL XL LVL3 (GOWN DISPOSABLE) ×2 IMPLANT
GOWN STRL REUS W/TWL 2XL LVL3 (GOWN DISPOSABLE) IMPLANT
GOWN STRL REUS W/TWL LRG LVL3 (GOWN DISPOSABLE) ×3
GOWN STRL REUS W/TWL XL LVL3 (GOWN DISPOSABLE) ×2
HEMOSTAT POWDER KIT SURGIFOAM (HEMOSTASIS) ×1 IMPLANT
KIT BASIN OR (CUSTOM PROCEDURE TRAY) ×1 IMPLANT
KIT GRAFTMAG DEL NEURO DISP (NEUROSURGERY SUPPLIES) IMPLANT
KIT TURNOVER KIT B (KITS) ×1 IMPLANT
NDL HYPO 21X1.5 SAFETY (NEEDLE) IMPLANT
NEEDLE HYPO 21X1.5 SAFETY (NEEDLE) ×1 IMPLANT
NEEDLE HYPO 22GX1.5 SAFETY (NEEDLE) ×1 IMPLANT
NS IRRIG 1000ML POUR BTL (IV SOLUTION) ×1 IMPLANT
PACK LAMINECTOMY NEURO (CUSTOM PROCEDURE TRAY) ×1 IMPLANT
PAD ARMBOARD 7.5X6 YLW CONV (MISCELLANEOUS) ×3 IMPLANT
PATTIES SURGICAL .5 X1 (DISPOSABLE) IMPLANT
PUTTY DBM 10CC CALC GRAN (Putty) IMPLANT
ROD CURVED TI 6.35X45 (Rod) IMPLANT
SCREW PA DLX CREO 7.5X50 (Screw) IMPLANT
SPIKE FLUID TRANSFER (MISCELLANEOUS) ×1 IMPLANT
SPONGE NEURO XRAY DETECT 1X3 (DISPOSABLE) IMPLANT
SPONGE SURGIFOAM ABS GEL 100 (HEMOSTASIS) IMPLANT
SPONGE T-LAP 4X18 ~~LOC~~+RFID (SPONGE) IMPLANT
STRIP CLOSURE SKIN 1/2X4 (GAUZE/BANDAGES/DRESSINGS) ×1 IMPLANT
SUT VIC AB 1 CT1 18XBRD ANBCTR (SUTURE) ×2 IMPLANT
SUT VIC AB 1 CT1 8-18 (SUTURE) ×2
SUT VIC AB 2-0 CP2 18 (SUTURE) ×2 IMPLANT
SYR 20ML LL LF (SYRINGE) IMPLANT
TOWEL GREEN STERILE (TOWEL DISPOSABLE) ×1 IMPLANT
TOWEL GREEN STERILE FF (TOWEL DISPOSABLE) ×1 IMPLANT
TRAY FOLEY MTR SLVR 16FR STAT (SET/KITS/TRAYS/PACK) ×1 IMPLANT
WATER STERILE IRR 1000ML POUR (IV SOLUTION) ×1 IMPLANT

## 2022-07-06 NOTE — Progress Notes (Signed)
Orthopedic Tech Progress Note Patient Details:  Amie Cowens 1965-10-20 [id number]  RN stated patient has back brace   Patient ID: Sharen Hones, female   DOB: 11-01-1965, 56 y.o.   MRN: [id number]  Janit Pagan 07/06/2022, 3:34 PM

## 2022-07-06 NOTE — Op Note (Signed)
Brief history: The patient is a 56 year old white female who is complained of back and right greater left leg pain consistent with neurogenic claudication.  She has failed medical management and was worked up with a lumbar MRI and lumbar x-rays which demonstrated an L4-5 spondylolisthesis and spinal stenosis.  I discussed the various treatment options with her.  She has decided proceed with surgery.  Preoperative diagnosis: L4-5 facet arthropathy, spondylolisthesis, degenerative disc disease, spinal stenosis compressing both the L4 and the L5 nerve roots; lumbago; lumbar radiculopathy; neurogenic claudication  Postoperative diagnosis: The same  Procedure: Bilateral L4-5 laminotomy/foraminotomies/medial facetectomy to decompress the bilateral L4 and L5 nerve roots(the work required to do this was in addition to the work required to do the posterior lumbar interbody fusion because of the patient's spinal stenosis, facet arthropathy. Etc. requiring a wide decompression of the nerve roots.);  Right L4-5 transforaminal lumbar interbody fusion with local morselized autograft bone and Zimmer DBM; insertion of interbody prosthesis at L4-5 (globus peek expandable interbody prosthesis); posterior nonsegmental instrumentation from L4 to L5 with globus titanium pedicle screws and rods; posterior lateral arthrodesis at L4-5 with local morselized autograft bone and Zimmer DBM.  Surgeon: Dr. Delma Officer  Asst.: Hildred Priest, NP  Anesthesia: Gen. endotracheal  Estimated blood loss: 150 cc  Drains: None  Complications: None  Description of procedure: The patient was brought to the operating room by the anesthesia team. General endotracheal anesthesia was induced. The patient was turned to the prone position on the Wilson frame. The patient's lumbosacral region was then prepared with Betadine scrub and Betadine solution. Sterile drapes were applied.  I then injected the area to be incised with Marcaine with  epinephrine solution. I then used the scalpel to make a linear midline incision over the L4-5 interspace. I then used electrocautery to perform a bilateral subperiosteal dissection exposing the spinous process and lamina of L4-5. We then obtained intraoperative radiograph to confirm our location. We then inserted the Verstrac retractor to provide exposure.  I began the decompression by using the high speed drill to perform laminotomies at L4-5 bilaterally. We then used the Kerrison punches to widen the laminotomy and removed the ligamentum flavum at L4-5 bilaterally. We used the Kerrison punches to remove the medial facets at L4-5 bilaterally, we removed the right L4-5 facet. We performed wide foraminotomies about the bilateral L4 and L5 nerve roots completing the decompression.  We now turned our attention to the posterior lumbar interbody fusion. I used a scalpel to incise the intervertebral disc at L4-5 bilaterally. I then performed a partial intervertebral discectomy at L4-5 bilaterally using the pituitary forceps. We prepared the vertebral endplates at L4-5 bilaterally for the fusion by removing the soft tissues with the curettes. We then used the trial spacers to pick the appropriate sized interbody prosthesis. We prefilled his prosthesis with a combination of local morselized autograft bone that we obtained during the decompression as well as Zimmer DBM. We inserted the prefilled prosthesis into the interspace at L4-5 from the right, we then turned and expanded the prosthesis. There was a good snug fit of the prosthesis in the interspace. We then filled and the remainder of the intervertebral disc space with local morselized autograft bone and Zimmer DBM. This completed the posterior lumbar interbody arthrodesis.  During the decompression and insertion of the prosthesis the assistant protected the thecal sac and nerve roots with the D'Errico retractor.  We now turned attention to the instrumentation.  Under fluoroscopic guidance we cannulated the bilateral  L4 and L5 pedicles with the bone probe. We then removed the bone probe. We then tapped the pedicle with a 6.5 millimeter tap. We then removed the tap. We probed inside the tapped pedicle with a ball probe to rule out cortical breaches. We then inserted a 7.5 x 50 millimeter pedicle screw into the L4 and L5 pedicles bilaterally under fluoroscopic guidance. We then palpated along the medial aspect of the pedicles to rule out cortical breaches. There were none. The nerve roots were not injured. We then connected the unilateral pedicle screws with a lordotic rod. We compressed the construct and secured the rod in place with the caps. We then tightened the caps appropriately. This completed the instrumentation from L4-5 bilaterally.  We now turned our attention to the posterior lateral arthrodesis at L4-5. We used the high-speed drill to decorticate the remainder of the facets, pars, transverse process at L4-5. We then applied a combination of local morselized autograft bone and Zimmer DBM over these decorticated posterior lateral structures. This completed the posterior lateral arthrodesis.  We then obtained hemostasis using bipolar electrocautery. We irrigated the wound out with saline solution. We inspected the thecal sac and nerve roots and noted they were well decompressed. We then removed the retractor.  We injected Exparel . We reapproximated patient's thoracolumbar fascia with interrupted #1 Vicryl suture. We reapproximated patient's subcutaneous tissue with interrupted 2-0 Vicryl suture. The reapproximated patient's skin with Steri-Strips and benzoin. The wound was then coated with bacitracin ointment. A sterile dressing was applied. The drapes were removed. The patient was subsequently returned to the supine position where they were extubated by the anesthesia team. He was then transported to the post anesthesia care unit in stable condition. All  sponge instrument and needle counts were reportedly correct at the end of this case.

## 2022-07-06 NOTE — Anesthesia Postprocedure Evaluation (Signed)
Anesthesia Post Note  Patient: Alexandra Lee  Procedure(s) Performed: Posterior Lumbar Interbody Fusion, Interbody Prosthesis, POSTERIOR INSTRUMENTATION Lumbar Four-Five     Patient location during evaluation: PACU Anesthesia Type: General Level of consciousness: awake and alert Pain management: pain level controlled Vital Signs Assessment: post-procedure vital signs reviewed and stable Respiratory status: spontaneous breathing, nonlabored ventilation, respiratory function stable and patient connected to nasal cannula oxygen Cardiovascular status: blood pressure returned to baseline and stable Postop Assessment: no apparent nausea or vomiting Anesthetic complications: no   No notable events documented.  Last Vitals:  Vitals:   07/06/22 1050 07/06/22 1105  BP: 116/63 119/73  Pulse: (!) 101 (!) 101  Resp: 14 13  Temp: 36.8 C   SpO2: 100% 98%    Last Pain:  Vitals:   07/06/22 1050  TempSrc:   PainSc: 3                  Santa Lighter

## 2022-07-06 NOTE — Anesthesia Procedure Notes (Addendum)
Procedure Name: Intubation Date/Time: 07/06/2022 7:38 AM  Performed by: Harden Mo, CRNAPre-anesthesia Checklist: Patient identified, Emergency Drugs available, Suction available and Patient being monitored Patient Re-evaluated:Patient Re-evaluated prior to induction Oxygen Delivery Method: Circle System Utilized Preoxygenation: Pre-oxygenation with 100% oxygen Induction Type: IV induction Ventilation: Mask ventilation without difficulty Laryngoscope Size: Mac and 3 Grade View: Grade I Tube type: Oral Tube size: 7.0 mm Number of attempts: 1 Airway Equipment and Method: Stylet and Oral airway Placement Confirmation: ETT inserted through vocal cords under direct vision, positive ETCO2 and breath sounds checked- equal and bilateral Secured at: 22 cm Tube secured with: Tape Dental Injury: Teeth and Oropharynx as per pre-operative assessment  Comments: SRNA K Venrick

## 2022-07-06 NOTE — Addendum Note (Signed)
Addendum  created 07/06/22 1129 by Harden Mo, CRNA   Flowsheet accepted

## 2022-07-06 NOTE — Transfer of Care (Signed)
Immediate Anesthesia Transfer of Care Note  Patient: Alexandra Lee  Procedure(s) Performed: Posterior Lumbar Interbody Fusion, Interbody Prosthesis, POSTERIOR INSTRUMENTATION Lumbar Four-Five  Patient Location: PACU  Anesthesia Type:General  Level of Consciousness: awake, alert  and oriented  Airway & Oxygen Therapy: Patient connected to face mask oxygen  Post-op Assessment: Post -op Vital signs reviewed and stable  Post vital signs: stable  Last Vitals:  Vitals Value Taken Time  BP 116/63 07/06/22 1050  Temp    Pulse 101 07/06/22 1050  Resp    SpO2 100 % 07/06/22 1050  Vitals shown include unvalidated device data.  Last Pain:  Vitals:   07/06/22 0655  TempSrc:   PainSc: 3       Patients Stated Pain Goal: 0 (19/50/93 2671)  Complications: No notable events documented.

## 2022-07-06 NOTE — H&P (Signed)
Subjective: The patient is a 56 year old white female who has complained of back and leg pain consistent with neurogenic claudication.  She has failed medical management and was worked up with a lumbar MRI and lumbar x-rays which demonstrated an L4-5 spondylolisthesis and severe stenosis.  I discussed the various treatment options with her.  She has decided proceed with surgery.  Past Medical History:  Diagnosis Date   Anemia    Anxiety    Back pain    Blood dyscrasia    GERD (gastroesophageal reflux disease)    tums after certain foods   Headache    occ   Hypothyroidism    PVC (premature ventricular contraction) no meds    increase pulse occasionally    SVT (supraventricular tachycardia)    UC (ulcerative colitis) ([place])     Past Surgical History:  Procedure Laterality Date   CESAREAN SECTION     x 2   COLONOSCOPY     multiple   DILATATION & CURETTAGE/HYSTEROSCOPY WITH MYOSURE N/A 02/26/2017   Procedure: DILATATION & CURETTAGE/HYSTEROSCOPY WITH MYOSURE;  Surgeon: Brien Few, MD;  Location: [facility];  Service: Gynecology;  Laterality: N/A;   DILATION AND CURETTAGE OF UTERUS     2007   RECONSTRUCTION OF NOSE     UPPER GI ENDOSCOPY      Allergies  Allergen Reactions   Doxycycline Diarrhea and Nausea And Vomiting   Ibuprofen Other (See Comments)    interacts with ulcerative colitis medications.   Latex Other (See Comments)    Skin reaction with prolonged contact (i.e. Bandages/sutures)    Social History   Tobacco Use   Smoking status: Never   Smokeless tobacco: Never  Substance Use Topics   Alcohol use: No    Family History  Problem Relation Age of Onset   Atrial fibrillation Mother    Pulmonary embolism Father    GER disease Sister    Other Sister        brain tumor   Hypothyroidism Sister    Hypothyroidism Sister    Prior to Admission medications   Medication Sig Start Date End Date Taking? Authorizing Provider  acetaminophen (TYLENOL) 325 MG tablet Take  650 mg by mouth every 6 (six) hours as needed for moderate pain.   Yes [provider]  bisacodyl 5 MG EC tablet Take 5 mg by mouth daily as needed for moderate constipation.   Yes [provider]  Ca Phosphate-Cholecalciferol (CALTRATE GUMMY BITES) 250-10 MG-MCG CHEW Chew 1 tablet by mouth every other day.   Yes [provider]  levothyroxine (SYNTHROID, LEVOTHROID) 50 MCG tablet Take 50 mcg by mouth daily before breakfast. 02/04/17  Yes [provider]  LORazepam (ATIVAN) 1 MG tablet Take 1 mg by mouth at bedtime.   Yes [provider]  Mesalamine (ASACOL) 400 MG CPDR DR capsule Take 400-800 mg by mouth See admin instructions. Take 800 mg in the morning and 400 mg in the evening, may take and additional 400-800 mg if needed.   Yes [provider]  mesalamine (CANASA) 1000 MG suppository Place 1,000 mg rectally at bedtime.   Yes [provider]  Multiple Vitamin (MULTIVITAMIN WITH MINERALS) TABS tablet Take 1 tablet by mouth daily.   Yes [provider]  Phenazopyridine HCl (AZO TABS PO) Take 1 tablet by mouth daily.   Yes [provider]  polyethylene glycol (MIRALAX / GLYCOLAX) 17 g packet Take 17 g by mouth daily.   Yes [provider]  propranolol (  INDERAL) 10 MG tablet Take 5 mg by mouth daily.   Yes [provider]  metoprolol succinate (TOPROL XL) 25 MG 24 hr tablet Take 1 tablet (25 mg total) by mouth daily. Patient not taking: Reported on 06/18/2022 11/18/21   Minus Breeding, MD     Review of Systems  Positive ROS: As above  All other systems have been reviewed and were otherwise negative with the exception of those mentioned in the HPI and as above.  Objective: Vital signs in last 24 hours: Temp:  [97.9 F (36.6 C)-98.5 F (36.9 C)] 98.5 F (36.9 C) (10/30 0555) Pulse Rate:  [95-99] 99 (10/30 0555) Resp:  [16-18] 18 (10/30 0555) BP: (123-131)/(77-81) 123/77 (10/30 0555) SpO2:   [98 %-99 %] 98 % (10/30 0555) Estimated body mass index is 25.29 kg/m as calculated from the following:   Height as of 06/23/22: 5\' 5"  (1.651 m).   Weight as of 06/23/22: 68.9 kg.   General Appearance: Alert Head: Normocephalic, without obvious abnormality, atraumatic Eyes: PERRL, conjunctiva/corneas clear, EOM's intact,    Ears: Normal  Throat: Normal  Neck: Supple, Back: unremarkable Lungs: Clear to auscultation bilaterally, respirations unlabored Heart: Regular rate and rhythm, no murmur, rub or gallop Abdomen: Soft, non-tender Extremities: Extremities normal, atraumatic, no cyanosis or edema Skin: unremarkable  NEUROLOGIC:   Mental status: alert and oriented,Motor Exam - grossly normal Sensory Exam - grossly normal Reflexes:  Coordination - grossly normal Gait - grossly normal Balance - grossly normal Cranial Nerves: I: smell Not tested  II: visual acuity  OS: Normal  OD: Normal   II: visual fields Full to confrontation  II: pupils Equal, round, reactive to light  III,VII: ptosis None  III,IV,VI: extraocular muscles  Full ROM  V: mastication Normal  V: facial light touch sensation  Normal  V,VII: corneal reflex  Present  VII: facial muscle function - upper  Normal  VII: facial muscle function - lower Normal  VIII: hearing Not tested  IX: soft palate elevation  Normal  IX,X: gag reflex Present  XI: trapezius strength  5/5  XI: sternocleidomastoid strength 5/5  XI: neck flexion strength  5/5  XII: tongue strength  Normal    Data Review Lab Results  Component Value Date   WBC 6.6 06/23/2022   HGB 13.9 06/23/2022   HCT 41.8 06/23/2022   MCV 94.8 06/23/2022   PLT 262 06/23/2022   Lab Results  Component Value Date   NA 139 06/23/2022   K 4.3 06/23/2022   CL 102 06/23/2022   CO2 27 06/23/2022   BUN 12 06/23/2022   CREATININE 0.51 06/23/2022   GLUCOSE 108 (H) 06/23/2022   No results found for: "INR", "PROTIME"  Assessment/Plan: Lumbar spine  listhesis, lumbar facet arthropathy, lumbar spinal stenosis, lumbago, neurogenic claudication, lumbar radiculopathy: I have discussed the situation with the patient.  I reviewed her imaging studies with her and pointed out the abnormalities.  We have discussed the various treatment options including surgery.  I have described the surgical treatment option of an L4-5 decompression, instrumentation and fusion.  I have shown her surgical models.  I have given her a surgical pamphlet.  We have discussed the risk, benefits, alternatives, expected postoperative course, and likelihood of achieving our goals with surgery.  I have answered all her questions.  She has decided proceed with surgery.   Ophelia Charter 07/06/2022 7:27 AM

## 2022-07-07 DIAGNOSIS — M4316 Spondylolisthesis, lumbar region: Secondary | ICD-10-CM | POA: Diagnosis not present

## 2022-07-07 MED ORDER — CYCLOBENZAPRINE HCL 10 MG PO TABS: 10.0000 mg | ORAL_TABLET | Freq: Three times a day (TID) | ORAL | 0 refills | Status: AC | PRN

## 2022-07-07 MED ORDER — DOCUSATE SODIUM 100 MG PO CAPS: 100.0000 mg | ORAL_CAPSULE | Freq: Two times a day (BID) | ORAL | 0 refills | Status: AC

## 2022-07-07 MED ORDER — OXYCODONE-ACETAMINOPHEN 5-325 MG PO TABS: 1.0000 | ORAL_TABLET | ORAL | 0 refills | Status: AC | PRN

## 2022-07-07 NOTE — Progress Notes (Signed)
Occupational Therapy Treatment Patient Details Name: Alexandra Lee MRN: [id number] DOB: [birth year] Today's Date: 07/07/2022   History of present illness 56 y/o female who presents on 10/30 s/p L4-5 PLIF. PMH includes anxiety, PVC, SVT.   OT comments  Patient is s/p PLIF L4-5 surgery resulting in functional limitations due to the deficits listed below (see OT problem list). Pt with ted hose and sustained MAP 76 the entire session in all positions. (Supine 113/60 sitting 119/58 standing 106/65) Pt progressed to sink level oral care with cup education. Pt completed full body dressing for home with figure 4 crossing of bil LE. Pt completed bed mobility with increased ability to follow sequence but still requires min cues Patient will benefit from skilled OT acutely to increase independence and safety with ADLS to allow discharge HHOT with RW.    Recommendations for follow up therapy are one component of a multi-disciplinary discharge planning process, led by the attending physician.  Recommendations may be updated based on patient status, additional functional criteria and insurance authorization.    Follow Up Recommendations  Home health OT    Assistance Recommended at Discharge PRN  Patient can return home with the following  A little help with bathing/dressing/bathroom;Assist for transportation   Equipment Recommendations  Other (comment) (RW)    Recommendations for Other Services      Precautions / Restrictions Precautions Precautions: Back Precaution Booklet Issued: Yes (comment) Precaution Comments: Reviewed handout and precautions Required Braces or Orthoses: Spinal Brace Spinal Brace: Lumbar corset;Applied in sitting position Restrictions Weight Bearing Restrictions: No       Mobility Bed Mobility Overal bed mobility: Needs Assistance Bed Mobility: Supine to Sit, Rolling, Sit to Supine Rolling: Min assist   Supine to sit: Min assist Sit to supine: Min assist Pt  needs step by step cues to roll onto side and push up into sitting. Pt with increased ability this session with increased time. Pt reports "that's so much easier than how I was doing it"        Transfers Overall transfer level: Needs assistance Equipment used: Rolling walker (2 wheels) Transfers: Sit to/from Stand Sit to Stand: Min guard    Pt needs min cues for hand placement on RW. Pt requesting RW for home if possible.             Balance                                           ADL either performed or assessed with clinical judgement   ADL Overall ADL's : Needs assistance/impaired Eating/Feeding: Independent   Grooming: Independent   Upper Body Bathing: Supervision/ safety   Lower Body Bathing: Supervison/ safety Lower Body Bathing Details (indicate cue type and reason): able to figure 4 Upper Body Dressing : Supervision/safety Upper Body Dressing Details (indicate cue type and reason): able to don brace Lower Body Dressing: Supervision/safety;Sit to/from stand Lower Body Dressing Details (indicate cue type and reason): figure 4 crossing and has reacher at home Toilet Transfer: Supervision/safety   Toileting- Architect and Hygiene: Supervision/safety       Functional mobility during ADLs: Supervision/safety;Rolling walker (2 wheels)  Back handout provided and reviewed adls in detail. Pt educated on: clothing between brace, never sleep in brace, set an alarm at night for medication, avoid sitting for long periods of time, correct bed positioning for sleeping, correct sequence for bed  mobility, avoiding lifting more than 5 pounds and never wash directly over incision. All education is complete and patient indicates understanding.     Extremity/Trunk Assessment Upper Extremity Assessment Upper Extremity Assessment: Overall WFL for tasks assessed   Lower Extremity Assessment Lower Extremity Assessment: Defer to PT evaluation    Cervical / Trunk Assessment Cervical / Trunk Assessment: Back Surgery    Vision Patient Visual Report: No change from baseline     Perception     Praxis      Cognition Arousal/Alertness: Awake/alert Behavior During Therapy: WFL for tasks assessed/performed Overall Cognitive Status: Impaired/Different from baseline Area of Impairment: Memory, Safety/judgement                     Memory: Decreased recall of precautions   Safety/Judgement: Decreased awareness of deficits     General Comments: needs repetition of precautions with functional task        Exercises      Shoulder Instructions       General Comments dressing dry and intact    Pertinent Vitals/ Pain       Pain Assessment Pain Assessment: No/denies pain  Home Living Family/patient expects to be discharged to:: Private residence Living Arrangements: Spouse/significant other;Children Available Help at Discharge: Family;Available PRN/intermittently Type of Home: House Home Access: Stairs to enter [employer] of Steps: 3 Entrance Stairs-Rails: None Home Layout: One level     Bathroom Shower/Tub: Occupational psychologist: Handicapped height     Home Equipment: Financial controller: Reacher Additional Comments: spouse taking 2 weeks off from work. spouse works a job that can be half home and half out of the house. pt has two sons with austism. the 65 yo requires (A) for changing diaper for bowel and bladder.      Prior Functioning/Environment              Frequency  Min 2X/week        Progress Toward Goals  OT Goals(current goals can now be found in the care plan section)     Acute Rehab OT Goals Patient Stated Goal: to be able to help kids OT Goal Formulation: With patient Time For Goal Achievement: 07/21/22 Potential to Achieve Goals: Good  Plan      Co-evaluation                 AM-PAC OT "6 Clicks" Daily Activity      Outcome Measure   Help from another person eating meals?: A Little Help from another person taking care of personal grooming?: A Little Help from another person toileting, which includes using toliet, bedpan, or urinal?: A Little Help from another person bathing (including washing, rinsing, drying)?: A Little Help from another person to put on and taking off regular upper body clothing?: A Little Help from another person to put on and taking off regular lower body clothing?: A Little 6 Click Score: 18    End of Session Equipment Utilized During Treatment: Rolling walker (2 wheels);Back brace  OT Visit Diagnosis: Unsteadiness on feet (R26.81);Muscle weakness (generalized) (M62.81)   Activity Tolerance Treatment limited secondary to medical complications (Comment) (decreased BP)   Patient Left in bed;with call bell/phone within reach;with bed alarm set   Nurse Communication Mobility status;Precautions        Time: 1027-2536 OT Time Calculation (min): 24 min  Charges: OT General Charges $OT Visit: 1 Visit OT Evaluation $OT Eval Moderate Complexity: 1 Mod   Brynn,  OTR/L  Acute Rehabilitation Services Office: [phone] .  07/07/22 0900  OT Time Calculation  OT Start Time (ACUTE ONLY) 0820  OT Stop Time (ACUTE ONLY) 0843  OT Time Calculation (min) 23 min  OT General Charges  $OT Visit 1 Visit  OT Treatments  $Self Care/Home Management  23-37 mins     Mateo Flow 07/07/2022, 9:01 AM

## 2022-07-07 NOTE — Plan of Care (Signed)

## 2022-07-07 NOTE — Progress Notes (Signed)
Patient alert and oriented, mae's well, voiding adequate amount of urine, swallowing without difficulty, no c/o pain at time of discharge. Patient discharged home with family. Script and discharged instructions given to patient. Patient and family stated understanding of instructions given. Patient has an appointment with Dr. Jenkins   

## 2022-07-07 NOTE — Discharge Summary (Signed)
Physician Discharge Summary     Providing Compassionate, Quality Care - Together   Patient ID: Alexandra Lee MRN: [id number] DOB/AGE: 02-25-1966 56 y.o.  Admit date: 07/06/2022 Discharge date: 07/07/2022  Admission Diagnoses: Spondylolisthesis of lumbar region  Discharge Diagnoses:  Principal Problem:   Spondylolisthesis of lumbar region   Discharged Condition: good  Hospital Course: Patient underwent an L4-5 PLIF by Dr. Arnoldo Morale on 07/06/2022. She was admitted to 3C02  following recovery from anesthesia in the PACU. His postoperative course has been uncomplicated. She has worked with both physical and occupational therapies who feel the patient is ready for discharge home. She is ambulating independently and without difficulty. She is tolerating a normal diet. She is not having any bowel or bladder dysfunction. Her pain is well-controlled with oral pain medication. She is ready for discharge home.   Consults: PT/OT  Significant Diagnostic Studies: radiology: DG Lumbar Spine 2-3 Views  Result Date: 07/06/2022 CLINICAL DATA:  L4-5 lumbar fusion EXAM: LUMBAR SPINE - 2-3 VIEW COMPARISON:  None Available. FINDINGS: Multiple intraoperative fluoroscopic spot images are provided. Posterior lumbar interbody fusion at L4-5. FLUOROSCOPY TIME:  Radiation Exposure Index: 11.4 mGy IMPRESSION: 1. Posterior lumbar interbody fusion at L4-5. Electronically Signed   By: Kathreen Devoid M.D.   On: 07/06/2022 11:45   DG Lumbar Spine 1 View  Result Date: 07/06/2022 CLINICAL DATA:  Surgical localization. EXAM: LUMBAR SPINE - 1 VIEW COMPARISON:  MRI of April 27, 2021. FINDINGS: Single intraoperative cross-table lateral projection was obtained of the lumbar spine. This demonstrates surgical probe at approximately the L4-5 level. IMPRESSION: Surgical localization as described above. Electronically Signed   By: Marijo Conception M.D.   On: 07/06/2022 11:23   DG C-Arm 1-60 Min-No Report  Result Date:  07/06/2022 Fluoroscopy was utilized by the requesting physician.  No radiographic interpretation.     Treatments: surgery: Bilateral L4-5 laminotomy/foraminotomies/medial facetectomy to decompress the bilateral L4 and L5 nerve roots(the work required to do this was in addition to the work required to do the posterior lumbar interbody fusion because of the patient's spinal stenosis, facet arthropathy. Etc. requiring a wide decompression of the nerve roots.);  Right L4-5 transforaminal lumbar interbody fusion with local morselized autograft bone and Zimmer DBM; insertion of interbody prosthesis at L4-5 (globus peek expandable interbody prosthesis); posterior nonsegmental instrumentation from L4 to L5 with globus titanium pedicle screws and rods; posterior lateral arthrodesis at L4-5 with local morselized autograft bone and Zimmer DBM.   Discharge Exam: Blood pressure 106/65, pulse 96, temperature 99.4 F (37.4 C), temperature source Oral, resp. rate 16, last menstrual period 02/13/2017, SpO2 100 %.  Per report: Alert and oriented x 4 PERRLA CN II-XII grossly intact MAE, Strength and sensation intact Incision is covered with Honeycomb dressing and Steri Strips; Dressing is clean, dry, and intact   Disposition: Discharge disposition: 01-Home or Self Care       Discharge Instructions     Call MD for:  difficulty breathing, headache or visual disturbances   Complete by: As directed    Call MD for:  persistant nausea and vomiting   Complete by: As directed    Call MD for:  redness, tenderness, or signs of infection (pain, swelling, redness, odor or green/yellow discharge around incision site)   Complete by: As directed    Call MD for:  severe uncontrolled pain   Complete by: As directed    Call MD for:  temperature >100.4   Complete by: As directed    Diet -  low sodium heart healthy   Complete by: As directed    Increase activity slowly   Complete by: As directed    Remove dressing  in 24 hours   Complete by: As directed       Allergies as of 07/07/2022       Reactions   Doxycycline Diarrhea, Nausea And Vomiting   Ibuprofen Other (See Comments)   interacts with ulcerative colitis medications.   Latex Other (See Comments)   Skin reaction with prolonged contact (i.e. Bandages/sutures)        Medication List     STOP taking these medications    metoprolol succinate 25 MG 24 hr tablet Commonly known as: Toprol XL       TAKE these medications    acetaminophen 325 MG tablet Commonly known as: TYLENOL Take 650 mg by mouth every 6 (six) hours as needed for moderate pain.   AZO TABS PO Take 1 tablet by mouth daily.   bisacodyl 5 MG EC tablet Generic drug: bisacodyl Take 5 mg by mouth daily as needed for moderate constipation.   Caltrate Gummy Bites 250-10 MG-MCG Chew Generic drug: Ca Phosphate-Cholecalciferol Chew 1 tablet by mouth every other day.   cyclobenzaprine 10 MG tablet Commonly known as: FLEXERIL Take 1 tablet (10 mg total) by mouth 3 (three) times daily as needed for muscle spasms.   docusate sodium 100 MG capsule Commonly known as: COLACE Take 1 capsule (100 mg total) by mouth 2 (two) times daily.   levothyroxine 50 MCG tablet Commonly known as: SYNTHROID Take 50 mcg by mouth daily before breakfast.   LORazepam 1 MG tablet Commonly known as: ATIVAN Take 1 mg by mouth at bedtime.   Mesalamine 400 MG Cpdr DR capsule Commonly known as: ASACOL Take 400-800 mg by mouth See admin instructions. Take 800 mg in the morning and 400 mg in the evening, may take and additional 400-800 mg if needed.   mesalamine 1000 MG suppository Commonly known as: CANASA Place 1,000 mg rectally at bedtime.   multivitamin with minerals Tabs tablet Take 1 tablet by mouth daily.   oxyCODONE-acetaminophen 5-325 MG tablet Commonly known as: Percocet Take 1-2 tablets by mouth every 4 (four) hours as needed for severe pain.   polyethylene glycol 17 g  packet Commonly known as: MIRALAX / GLYCOLAX Take 17 g by mouth daily.   propranolol 10 MG tablet Commonly known as: INDERAL Take 5 mg by mouth daily.               Durable Medical Equipment  (From admission, onward)           Start     Ordered   07/07/22 0915  For home use only DME Walker rolling  Once       Question Answer Comment  Walker: With [place] Wheels   Patient needs a walker to treat with the following condition S/P lumbar spinal fusion      07/07/22 0915            Follow-up Information     Newman Pies, MD. Go on 07/28/2022.   Specialty: Neurosurgery Why: First post op appointment is 07/28/2022 at 1:45 PM. Contact information: [address] [place] [address] [phone]                 Signed: Viona Gilmore, DNP, AGNP-C Nurse Practitioner  [facility] [place] [address] [place], [place], [place] [address] P: [phone]  F: [phone]  07/07/2022, 10:49 AM

## 2022-07-07 NOTE — Discharge Instructions (Signed)
Wound Care Keep incision covered and dry for two days.    Do not put any creams, lotions, or ointments on incision. Leave steri-strips on back.  They will fall off by themselves. You are fine to shower. Let water run over incision and pat dry.  Activity Walk each and every day, increasing distance each day. No lifting greater than 5 lbs.  Avoid excessive back motion. No driving for 2 weeks; may ride as a passenger locally.  Diet Resume your normal diet.   Return to Work Will be discussed at your follow up appointment.  Call Your Doctor If Any of These Occur Redness, drainage, or swelling at the wound.  Temperature greater than 101 degrees. Severe pain not relieved by pain medication. Incision starts to come apart.  Follow Up Appt Call [phone] today for appointment in 2-3 weeks if you don't already have one or for any problems.  If you have any hardware placed in your spine, you will need an x-ray before your appointment.  

## 2022-07-07 NOTE — Progress Notes (Signed)
Subjective: The patient is alert and pleasant.  She had an episode of lightheadedness and hypotension this morning.  Objective: Vital signs in last 24 hours: Temp:  [97.8 F (36.6 C)-99.4 F (37.4 C)] 99.4 F (37.4 C) (10/31 0725) Pulse Rate:  [75-101] 92 (10/31 0725) Resp:  [12-18] 16 (10/31 0725) BP: (97-130)/(53-74) 105/58 (10/31 0725) SpO2:  [98 %-100 %] 100 % (10/31 0725) Estimated body mass index is 25.29 kg/m as calculated from the following:   Height as of 06/23/22: 5\' 5"  (1.651 m).   Weight as of 06/23/22: 68.9 kg.   Intake/Output from previous day: 10/30 0701 - 10/31 0700 In: 1975.6 [P.O.:960; I.V.:1015.6] Out: 600 [Urine:400; Blood:200] Intake/Output this shift: No intake/output data recorded.  Physical exam the patient is alert and pleasant.  Her strength is grossly normal.  Lab Results: No results for input(s): "WBC", "HGB", "HCT", "PLT" in the last 72 hours. BMET No results for input(s): "NA", "K", "CL", "CO2", "GLUCOSE", "BUN", "CREATININE", "CALCIUM" in the last 72 hours.  Studies/Results: DG Lumbar Spine 2-3 Views  Result Date: 07/06/2022 CLINICAL DATA:  L4-5 lumbar fusion EXAM: LUMBAR SPINE - 2-3 VIEW COMPARISON:  None Available. FINDINGS: Multiple intraoperative fluoroscopic spot images are provided. Posterior lumbar interbody fusion at L4-5. FLUOROSCOPY TIME:  Radiation Exposure Index: 11.4 mGy IMPRESSION: 1. Posterior lumbar interbody fusion at L4-5. Electronically Signed   By: Kathreen Devoid M.D.   On: 07/06/2022 11:45   DG Lumbar Spine 1 View  Result Date: 07/06/2022 CLINICAL DATA:  Surgical localization. EXAM: LUMBAR SPINE - 1 VIEW COMPARISON:  MRI of April 27, 2021. FINDINGS: Single intraoperative cross-table lateral projection was obtained of the lumbar spine. This demonstrates surgical probe at approximately the L4-5 level. IMPRESSION: Surgical localization as described above. Electronically Signed   By: Marijo Conception M.D.   On: 07/06/2022 11:23    DG C-Arm 1-60 Min-No Report  Result Date: 07/06/2022 Fluoroscopy was utilized by the requesting physician.  No radiographic interpretation.    Assessment/Plan: Postop day 1: We will mobilize the patient with PT and OT.  She may go home later on today.  I gave her her discharge instructions and answered all her questions.  LOS: 0 days     Ophelia Charter 07/07/2022, 7:48 AM

## 2022-07-07 NOTE — Evaluation (Signed)
Physical Therapy Evaluation Patient Details Name: Alexandra Lee MRN: [id number] DOB: Jul 14, 1966 Today's Date: 07/07/2022  History of Present Illness  56 y/o female who presents on 10/30 s/p L4-5 PLIF. PMH includes anxiety, PVC, SVT.  Clinical Impression  Patient presents with pain and post surgical deficits s/p above surgery. Pt independent and lives at home with her spouse and children. Today, pt requires Min guard for transfers and gait training with use of RW for support. Requires Min A for stair negotiation with HHA to simulate home as pt has no rail. Spouse to assist with stairs. Education re: back precautions, log roll technique, brace wearing, positioning, car transfer, walking program, sleeping positions etc. Pt has support from spouse at home. All education completed. Does not require further skilled therapy services. Discharge from therapy.       Recommendations for follow up therapy are one component of a multi-disciplinary discharge planning process, led by the attending physician.  Recommendations may be updated based on patient status, additional functional criteria and insurance authorization.  Follow Up Recommendations No PT follow up      Assistance Recommended at Discharge Intermittent Supervision/Assistance  Patient can return home with the following  Assist for transportation;Help with stairs or ramp for entrance;Assistance with cooking/housework;A little help with bathing/dressing/bathroom    Equipment Recommendations Rolling walker (2 wheels)  Recommendations for Other Services       Functional Status Assessment Patient has had a recent decline in their functional status and demonstrates the ability to make significant improvements in function in a reasonable and predictable amount of time.     Precautions / Restrictions Precautions Precautions: Back Precaution Booklet Issued: Yes (comment) Precaution Comments: Reviewed handout and precautions Required Braces  or Orthoses: Spinal Brace Spinal Brace: Lumbar corset;Applied in sitting position Restrictions Weight Bearing Restrictions: No      Mobility  Bed Mobility Overal bed mobility: Needs Assistance Bed Mobility: Rolling, Sidelying to Sit, Sit to Sidelying Rolling: Min guard Sidelying to sit: Min guard   Sit to supine: Min guard   General bed mobility comments: HOB flat, no use of rails to simulate home, cues for log roll technique, no assist, increased time.    Transfers Overall transfer level: Needs assistance Equipment used: Rolling walker (2 wheels) Transfers: Sit to/from Stand Sit to Stand: Min guard           General transfer comment: Min guard for safety. Stood from [employer]. Cues for hand placement/technique.    Ambulation/Gait Ambulation/Gait assistance: Supervision Gait Distance (Feet): 300 Feet Assistive device: Rolling walker (2 wheels) Gait Pattern/deviations: Step-through pattern, Decreased stride length Gait velocity: decreased Gait velocity interpretation: 1.31 - 2.62 ft/sec, indicative of limited community ambulator   General Gait Details: Slow, guarded gait with use of RW.  Stairs Stairs: Yes Stairs assistance: Min assist Stair Management: Step to pattern, Forwards Number of Stairs: 2 General stair comments: HHA to ascend steps simulating spouse assist at home,  Wheelchair Mobility    Modified Rankin (Stroke Patients Only)       Balance Overall balance assessment: Mild deficits observed, not formally tested                                           Pertinent Vitals/Pain Pain Assessment Pain Assessment: Faces Faces Pain Scale: Hurts little more Pain Location: back Pain Descriptors / Indicators: Sore, Operative site guarding Pain Intervention(s): Monitored during  session, Repositioned    Home Living Family/patient expects to be discharged to:: Private residence Living Arrangements: Spouse/significant  other;Children Available Help at Discharge: Family;Available PRN/intermittently Type of Home: House Home Access: Stairs to enter Entrance Stairs-Rails: None Entrance Stairs-Number of Steps: 3   Home Layout: One level Home Equipment: Adaptive equipment Additional Comments: spouse taking 2 weeks off from work. spouse works a job that can be half home and half out of the house. pt has two sons with austism. the 73 yo requires (A) for changing diaper for bowel and bladder.    Prior Function Prior Level of Function : Independent/Modified Independent             Mobility Comments: Works from home       [place]: Right    Extremity/Trunk Assessment   Upper Extremity Assessment Upper Extremity Assessment: Defer to OT evaluation    Lower Extremity Assessment Lower Extremity Assessment: Overall WFL for tasks assessed;RLE deficits/detail;LLE deficits/detail RLE Deficits / Details: radiating symptoms down leg into foot RLE Sensation: decreased light touch    Cervical / Trunk Assessment Cervical / Trunk Assessment: Back Surgery  Communication   Communication: No difficulties  Cognition Arousal/Alertness: Awake/alert Behavior During Therapy: WFL for tasks assessed/performed Overall Cognitive Status: Within Functional Limits for tasks assessed                                 General Comments: Asking appropriate questions relating to ADls and precautions        General Comments General comments (skin integrity, edema, etc.): Able to donn brace with setup.    Exercises     Assessment/Plan    PT Assessment Patient does not need any further PT services  PT Problem List         PT Treatment Interventions      PT Goals (Current goals can be found in the Care Plan section)  Acute Rehab PT Goals Patient Stated Goal: to go home PT Goal Formulation: All assessment and education complete, DC therapy    Frequency       Co-evaluation                AM-PAC PT "6 Clicks" Mobility  Outcome Measure Help needed turning from your back to your side while in a flat bed without using bedrails?: A Little Help needed moving from lying on your back to sitting on the side of a flat bed without using bedrails?: A Little Help needed moving to and from a bed to a chair (including a wheelchair)?: A Little Help needed standing up from a chair using your arms (e.g., wheelchair or bedside chair)?: A Little Help needed to walk in hospital room?: A Little Help needed climbing 3-5 steps with a railing? : A Little 6 Click Score: 18    End of Session Equipment Utilized During Treatment: Back brace Activity Tolerance: Patient tolerated treatment well Patient left: in bed;with call bell/phone within reach;with nursing/sitter in room Nurse Communication: Mobility status PT Visit Diagnosis: Pain;Difficulty in walking, not elsewhere classified (R26.2);Muscle weakness (generalized) (M62.81) Pain - part of body:  (back)    Time: 7628-3151 PT Time Calculation (min) (ACUTE ONLY): 24 min   Charges:   PT Evaluation $PT Eval Moderate Complexity: 1 Mod PT Treatments $Gait Training: 8-22 mins        Marisa Severin, PT, DPT Acute Rehabilitation Services Secure chat preferred Office [phone]  Marguarite Arbour A Dilpreet Faires 07/07/2022, 9:22 AM

## 2022-07-07 NOTE — Evaluation (Signed)
Occupational Therapy Evaluation Patient Details Name: Alexandra Lee MRN: [id number] DOB: 05-06-1966 Today's Date: 07/07/2022   History of Present Illness 56 y/o female who presents on 10/30 s/p L4-5 PLIF. PMH includes anxiety, PVC, SVT.   Clinical Impression   Patient is s/p PLIF L4-5 surgery resulting in functional limitations due to the deficits listed below (see OT problem list). Session limited by decreased BP. Pt up in bathroom on arrival with increasing dizziness. BP reading supine 85/48  MAP (60). Pt stated "I feel like I am going to pass out now" so reading was supine. Pt educated on back precautions and bed mobility. Requesting ted hose to help with BP management from RN and measured for sizing.  Patient will benefit from skilled OT acutely to increase independence and safety with ADLS to allow discharge Hhot with RW. Pt has to help (A) 63 yo son with hygiene needs and concerned with back precautions. .       Recommendations for follow up therapy are one component of a multi-disciplinary discharge planning process, led by the attending physician.  Recommendations may be updated based on patient status, additional functional criteria and insurance authorization.   Follow Up Recommendations  Home health OT    Assistance Recommended at Discharge PRN  Patient can return home with the following A little help with bathing/dressing/bathroom;Assist for transportation    Functional Status Assessment  Patient has had a recent decline in their functional status and demonstrates the ability to make significant improvements in function in a reasonable and predictable amount of time.  Equipment Recommendations  Other (comment) (RW)    Recommendations for Other Services       Precautions / Restrictions Precautions Precautions: Back Precaution Booklet Issued: Yes (comment) Precaution Comments: Reviewed handout and precautions Required Braces or Orthoses: Spinal Brace Spinal Brace:  Lumbar corset;Applied in sitting position Restrictions Weight Bearing Restrictions: No      Mobility Bed Mobility Overal bed mobility: Needs Assistance Bed Mobility: Supine to Sit, Rolling, Sit to Supine Rolling: Min assist    Sit to supine: Min assist  Pt requires max (A) to return to supine with back precautions. Pt unable to sequence task at this time.       Transfers Overall transfer level: Needs assistance Equipment used: Rolling walker (2 wheels) Transfers: Sit to/from Stand Sit to Stand: Min guard                  Balance                                           ADL either performed or assessed with clinical judgement   ADL Overall ADL's : Needs assistance/impaired Eating/Feeding: Independent   Grooming: Independent   Toilet Transfer: Supervision/safety   Toileting- Clothing Manipulation and Hygiene: Supervision/safety       Functional mobility during ADLs: Supervision/safety;Rolling walker (2 wheels)   Pt able to complete toilet transfer and sink level washing hands with increased dizziness. Pt required return to supine due to bp decreased     Vision Patient Visual Report: No change from baseline       Perception     Praxis      Pertinent Vitals/Pain Pain Assessment Pain Assessment: No/denies pain     Hand Dominance Right   Extremity/Trunk Assessment Upper Extremity Assessment Upper Extremity Assessment: Overall WFL for tasks assessed   Lower Extremity Assessment  Lower Extremity Assessment: Defer to PT evaluation   Cervical / Trunk Assessment Cervical / Trunk Assessment: Back Surgery   Communication Communication Communication: No difficulties   Cognition Arousal/Alertness: Awake/alert Behavior During Therapy: WFL for tasks assessed/performed Overall Cognitive Status: Impaired/Different from baseline Area of Impairment: Memory, Safety/judgement                     Memory: Decreased recall of  precautions   Safety/Judgement: Decreased awareness of deficits     General Comments: needs repetition of precautions with functional task     General Comments  dressing dry and intact    Exercises     Shoulder Instructions      Home Living Family/patient expects to be discharged to:: Private residence Living Arrangements: Spouse/significant other;Children Available Help at Discharge: Family;Available PRN/intermittently Type of Home: House Home Access: Stairs to enter [employer] of Steps: 3 Entrance Stairs-Rails: None Home Layout: One level     Bathroom Shower/Tub: Occupational psychologist: Handicapped height     Home Equipment: Financial controller: Reacher Additional Comments: spouse taking 2 weeks off from work. spouse works a job that can be half home and half out of the house. pt has two sons with austism. the 60 yo requires (A) for changing diaper for bowel and bladder.      Prior Functioning/Environment Prior Level of Function : Independent/Modified Independent                        OT Problem List: Decreased strength;Decreased activity tolerance;Impaired balance (sitting and/or standing);Decreased cognition;Decreased safety awareness;Decreased knowledge of use of DME or AE;Decreased knowledge of precautions      OT Treatment/Interventions: Self-care/ADL training;Therapeutic exercise;DME and/or AE instruction;Manual therapy;Modalities;Therapeutic activities;Cognitive remediation/compensation;Balance training;Patient/family education    OT Goals(Current goals can be found in the care plan section) Acute Rehab OT Goals Patient Stated Goal: to be able to help kids OT Goal Formulation: With patient Time For Goal Achievement: 07/21/22 Potential to Achieve Goals: Good  OT Frequency: Min 2X/week    Co-evaluation              AM-PAC OT "6 Clicks" Daily Activity     Outcome Measure Help from another person  eating meals?: A Little Help from another person taking care of personal grooming?: A Little Help from another person toileting, which includes using toliet, bedpan, or urinal?: A Little Help from another person bathing (including washing, rinsing, drying)?: A Little Help from another person to put on and taking off regular upper body clothing?: A Little Help from another person to put on and taking off regular lower body clothing?: A Little 6 Click Score: 18   End of Session Equipment Utilized During Treatment: Rolling walker (2 wheels);Back brace Nurse Communication: Mobility status;Precautions  Activity Tolerance: Treatment limited secondary to medical complications (Comment) (decreased BP) Patient left: in bed;with call bell/phone within reach;with bed alarm set  OT Visit Diagnosis: Unsteadiness on feet (R26.81);Muscle weakness (generalized) (M62.81)                Time: 4081-4481 OT Time Calculation (min): 24 min Charges:  OT General Charges $OT Visit: 1 Visit OT Evaluation $OT Eval Moderate Complexity: 1 Mod   Brynn, OTR/L  Acute Rehabilitation Services Office: [phone] .   Jeri Modena 07/07/2022, 9:00 AM

## 2022-07-16 MED FILL — Electrolyte-R (PH 7.4) Solution: INTRAVENOUS | Qty: 1000 | Status: AC

## 2022-07-16 MED FILL — Sodium Chloride Irrigation Soln 0.9%: Qty: 3000 | Status: AC

## 2022-10-05 ENCOUNTER — Ambulatory Visit
Admission: RE | Admit: 2022-10-05 | Discharge: 2022-10-05 | Disposition: A | Discharge status: Home / Self Care | Payer: 59 | Source: Ambulatory Visit | Attending: Urgent Care | Admitting: Urgent Care

## 2022-10-05 VITALS — BP 122/84 | HR 116 | Temp 98.1°F | Resp 20

## 2022-10-05 DIAGNOSIS — H8110 Benign paroxysmal vertigo, unspecified ear: Secondary | ICD-10-CM | POA: Diagnosis not present

## 2022-10-05 DIAGNOSIS — R42 Dizziness and giddiness: Secondary | ICD-10-CM | POA: Diagnosis not present

## 2022-10-05 DIAGNOSIS — Z8679 Personal history of other diseases of the circulatory system: Secondary | ICD-10-CM | POA: Diagnosis not present

## 2022-10-05 DIAGNOSIS — R000 Tachycardia, unspecified: Secondary | ICD-10-CM

## 2022-10-05 MED ORDER — MECLIZINE HCL 12.5 MG PO TABS: 12.5000 mg | ORAL_TABLET | Freq: Three times a day (TID) | ORAL | 0 refills | Status: AC | PRN

## 2022-10-05 NOTE — ED Provider Notes (Signed)
[facility]  Note:  This document was prepared using Conservation officer, historic buildings and may include unintentional dictation errors.  MRN: [id number] DOB: [birth year]  Subjective:   Kina Shiffman is a 57 y.o. female presenting for 1 day history of acute onset persistent intermittent dizziness, vertigo.  Has had intermittent ringing of the right ear since October as well.  Patient reports that the dizziness lasts for few seconds and resolves by staying still.  It is exacerbated by changing positions for instance sitting up from laying down or standing up.  No fever, ear pain, ear drainage, confusion, headaches, rashes.  She is recovering from her recent surgery that she had of her back.  She has a history of SVT, palpitations, PVCs.  Takes propranolol consistently for this.  Does not feel that this is heart related.  She does have a cardiologist.  No current facility-administered medications for this encounter.  Current Outpatient Medications:    acetaminophen (TYLENOL) 325 MG tablet, Take 650 mg by mouth every 6 (six) hours as needed for moderate pain., Disp: , Rfl:    bisacodyl 5 MG EC tablet, Take 5 mg by mouth daily as needed for moderate constipation., Disp: , Rfl:    Ca Phosphate-Cholecalciferol (CALTRATE GUMMY BITES) 250-10 MG-MCG CHEW, Chew 1 tablet by mouth every other day., Disp: , Rfl:    cyclobenzaprine (FLEXERIL) 10 MG tablet, Take 1 tablet (10 mg total) by mouth 3 (three) times daily as needed for muscle spasms., Disp: 30 tablet, Rfl: 0   docusate sodium (COLACE) 100 MG capsule, Take 1 capsule (100 mg total) by mouth 2 (two) times daily., Disp: 10 capsule, Rfl: 0   levothyroxine (SYNTHROID, LEVOTHROID) 50 MCG tablet, Take 50 mcg by mouth daily before breakfast., Disp: , Rfl: 0   LORazepam (ATIVAN) 1 MG tablet, Take 1 mg by mouth at bedtime., Disp: , Rfl:    Mesalamine (ASACOL) 400 MG CPDR DR capsule, Take 400-800 mg by mouth See admin instructions. Take  800 mg in the morning and 400 mg in the evening, may take and additional 400-800 mg if needed., Disp: , Rfl:    mesalamine (CANASA) 1000 MG suppository, Place 1,000 mg rectally at bedtime., Disp: , Rfl:    Multiple Vitamin (MULTIVITAMIN WITH MINERALS) TABS tablet, Take 1 tablet by mouth daily., Disp: , Rfl:    oxyCODONE-acetaminophen (PERCOCET) 5-325 MG tablet, Take 1-2 tablets by mouth every 4 (four) hours as needed for severe pain., Disp: 30 tablet, Rfl: 0   Phenazopyridine HCl (AZO TABS PO), Take 1 tablet by mouth daily., Disp: , Rfl:    polyethylene glycol (MIRALAX / GLYCOLAX) 17 g packet, Take 17 g by mouth daily., Disp: , Rfl:    propranolol (INDERAL) 10 MG tablet, Take 5 mg by mouth daily., Disp: , Rfl:    Allergies  Allergen Reactions   Doxycycline Diarrhea and Nausea And Vomiting   Flexeril [Cyclobenzaprine]     Hypotension    Ibuprofen Other (See Comments)    interacts with ulcerative colitis medications.   Latex Other (See Comments)    Skin reaction with prolonged contact (i.e. Bandages/sutures)    Past Medical History:  Diagnosis Date   Anemia    Anxiety    Back pain    Blood dyscrasia    GERD (gastroesophageal reflux disease)    tums after certain foods   Headache    occ   Hypothyroidism    PVC (premature ventricular contraction) no meds    increase  pulse occasionally    SVT (supraventricular tachycardia)    UC (ulcerative colitis) ([place])      Past Surgical History:  Procedure Laterality Date   BACK SURGERY     CESAREAN SECTION     x 2   COLONOSCOPY     multiple   DILATATION & CURETTAGE/HYSTEROSCOPY WITH MYOSURE N/A 02/26/2017   Procedure: DILATATION & CURETTAGE/HYSTEROSCOPY WITH MYOSURE;  Surgeon: Brien Few, MD;  Location: [facility];  Service: Gynecology;  Laterality: N/A;   DILATION AND CURETTAGE OF UTERUS     2007   RECONSTRUCTION OF NOSE     UPPER GI ENDOSCOPY      Family History  Problem Relation Age of Onset   Atrial fibrillation Mother     Pulmonary embolism Father    GER disease Sister    Other Sister        brain tumor   Hypothyroidism Sister    Hypothyroidism Sister     Social History   Tobacco Use   Smoking status: Never   Smokeless tobacco: Never  Vaping Use   Vaping Use: Never used  Substance Use Topics   Alcohol use: No   Drug use: No    ROS   Objective:   Vitals: BP 122/84 (BP Location: Left Arm)   Pulse (!) 116   Temp 98.1 F (36.7 C) (Oral)   Resp 20   LMP 02/13/2017 (Exact Date)   SpO2 99%   Wt Readings from Last 3 Encounters:  06/23/22 152 lb (68.9 kg)  10/28/21 152 lb 3.2 oz (69 kg)  05/27/21 158 lb (71.7 kg)   Temp Readings from Last 3 Encounters:  10/05/22 98.1 F (36.7 C) (Oral)  07/07/22 99.4 F (37.4 C) (Oral)  07/05/22 97.9 F (36.6 C) (Oral)   BP Readings from Last 3 Encounters:  10/05/22 122/84  07/07/22 106/65  07/05/22 131/81   Pulse Readings from Last 3 Encounters:  10/05/22 (!) 116  07/07/22 96  07/05/22 95   Physical Exam Constitutional:      General: She is not in acute distress.    Appearance: Normal appearance. She is well-developed. She is not ill-appearing, toxic-appearing or diaphoretic.  HENT:     Head: Normocephalic and atraumatic.     Right Ear: Tympanic membrane, ear canal and external ear normal. No tenderness. There is no impacted cerumen. Tympanic membrane is not injected, perforated, erythematous or bulging.     Left Ear: Tympanic membrane, ear canal and external ear normal. No tenderness. There is no impacted cerumen. Tympanic membrane is not injected, perforated, erythematous or bulging.     Ears:     Comments: Positive [place]pike.    Nose: Nose normal.     Mouth/Throat:     Mouth: Mucous membranes are moist.  Eyes:     General: No scleral icterus.       Right eye: No discharge.        Left eye: No discharge.     Extraocular Movements: Extraocular movements intact.  Cardiovascular:     Rate and Rhythm: Normal rate and regular rhythm.      Heart sounds: Normal heart sounds. No murmur heard.    No friction rub. No gallop.  Pulmonary:     Effort: Pulmonary effort is normal. No respiratory distress.     Breath sounds: No stridor. No wheezing, rhonchi or rales.  Chest:     Chest wall: No tenderness.  Skin:    General: Skin is warm and dry.  Neurological:  General: No focal deficit present.     Mental Status: She is alert and oriented to person, place, and time.  Psychiatric:        Mood and Affect: Mood normal.        Behavior: Behavior normal.     ED ECG REPORT   Date: 10/05/2022  EKG Time: 1:05 PM  Rate: 107bpm  Rhythm: Sinus tachycardia   Axis: normal  Intervals:none  ST&T Change: T wave flattening in lead I, aVL  Narrative Interpretation: Sinus tachycardia at 107 bpm with nonspecific T wave changes as above.  The T wave change in lead I is new compared to previous EKGs.  Assessment and Plan :   PDMP not reviewed this encounter.  1. BPPV (benign paroxysmal positional vertigo), unspecified laterality   2. Dizziness   3. Tachycardia, unspecified   4. History of supraventricular tachycardia     Patient does have sustained tachycardia in clinic.  I do not believe that she is in SVT or has any arrhythmia.  The EKG is without acute findings.  Emphasized need for very close follow-up with her cardiologist.  Maintain her beta-blocker medications.  With respect to her dizziness, suspect that the underlying issues BPPV given physical exam, the nature of the dizziness itself.  Recommended meclizine, discussed general management.  She can follow-up with ENT if her symptoms persist.  Counseled patient on potential for adverse effects with medications prescribed/recommended today, ER and return-to-clinic precautions discussed, patient verbalized understanding.    Jaynee Eagles, PA-C 10/05/22 1318

## 2022-10-05 NOTE — ED Triage Notes (Addendum)
Pt c/o dizziness started yesterday am when she was getting out of bed and getting into bed-also c/o ringing in right ear started after back surgery 06/2022-NAD-slow gait

## 2023-02-25 ENCOUNTER — Ambulatory Visit (INDEPENDENT_AMBULATORY_CARE_PROVIDER_SITE_OTHER): Payer: 59 | Admitting: Podiatry

## 2023-02-25 DIAGNOSIS — L600 Ingrowing nail: Secondary | ICD-10-CM | POA: Diagnosis not present

## 2023-02-25 NOTE — Progress Notes (Signed)
Subjective:  Patient ID: Alexandra Lee, female    DOB: 07-19-1966,  MRN: [id number]  Chief Complaint  Patient presents with   Ingrown Toenail    Pt states she cute the right great toenail but now the lower part of the nail hurts she noticed the ingrown about 3-4 weeks ago and now the color has changed with lots of pain when walking    57 y.o. female presents with concern for ingrown nail on the right great toenail.  She has pain across the entire border of the right hallux nail.  Concern for ingrown.  She says that the nail border has changed color with redness and pain with pressure on the area.  She does report a history of nail damage to the right side great toe and has previously come off in the past.  Past Medical History:  Diagnosis Date   Anemia    Anxiety    Back pain    Blood dyscrasia    GERD (gastroesophageal reflux disease)    tums after certain foods   Headache    occ   Hypothyroidism    PVC (premature ventricular contraction) no meds    increase pulse occasionally    SVT (supraventricular tachycardia)    UC (ulcerative colitis) (HCC)     Allergies  Allergen Reactions   Doxycycline Diarrhea and Nausea And Vomiting   Flexeril [Cyclobenzaprine]     Hypotension    Ibuprofen Other (See Comments)    interacts with ulcerative colitis medications.   Latex Other (See Comments)    Skin reaction with prolonged contact (i.e. Bandages/sutures)    ROS: Negative except as per HPI above  Objective:  General: AAO x3, NAD  Dermatological: Ingrowth of the entire border proximally of the right hallux nail with pain on palpation of the nail border.  There is erythema and mild edema but no drainage.  Vascular:  Dorsalis Pedis artery and Posterior Tibial artery pedal pulses are 2/4 bilateral.  Capillary fill time < 3 sec to all digits.   Neruologic: Grossly intact via light touch bilateral. Protective threshold intact to all sites bilateral.   Musculoskeletal: No gross  boney pedal deformities bilateral. No pain, crepitus, or limitation noted with foot and ankle range of motion bilateral. Muscular strength 5/5 in all groups tested bilateral.  Gait: Unassisted, Nonantalgic.   No images are attached to the encounter.  Radiographs:  Deferred Assessment:   1. Ingrown nail of great toe of right foot      Plan:  Patient was evaluated and treated and all questions answered.  Ingrown Nail, right total nail -Patient elects to proceed with minor surgery to remove ingrown toenail today. Consent reviewed and signed by patient. -Ingrown nail excised. See procedure note. -Educated on post-procedure care including soaking. Written instructions provided and reviewed. -Patient to follow up in 2 weeks for nail check.  Procedure: Excision of Ingrown Toenail Location: Right 1st toe  total  nail  Anesthesia: Lidocaine 1% plain; 1.5 mL and Marcaine 0.5% plain; 1.5 mL, digital block. Skin Prep: Betadine. Dressing: Silvadene; telfa; dry, sterile, compression dressing. Technique: Following skin prep, the toe was exsanguinated and a tourniquet was secured at the base of the toe. The affected nail border was freed, split with a nail splitter, and excised. Chemical matrixectomy was then performed with phenol and irrigated out with alcohol. The tourniquet was then removed and sterile dressing applied. Disposition: Patient tolerated procedure well. Patient to return in 2 weeks for follow-up.    Return  in about 2 weeks (around 03/11/2023) for nail check.          Corinna Gab, DPM [facility] / [facility]

## 2023-02-25 NOTE — Patient Instructions (Signed)

## 2023-03-10 ENCOUNTER — Telehealth: Payer: Self-pay | Admitting: Podiatry

## 2023-03-10 MED ORDER — CEPHALEXIN 500 MG PO CAPS: 500.0000 mg | ORAL_CAPSULE | Freq: Four times a day (QID) | ORAL | 0 refills | Status: AC

## 2023-03-10 NOTE — Telephone Encounter (Signed)
Pt left message yesterday 7.2.2024 at 242pm stating that Dr Annamary Rummage did a permanent nail removal on 6.20 and the area below where nail was removed is now red, swollen and angry looking with some pain. She was on antibiotics when the procedure was done from pcp and did complete them but it did not really help. Please Advise

## 2023-03-15 ENCOUNTER — Ambulatory Visit: Payer: 59 | Admitting: Podiatry

## 2023-03-15 DIAGNOSIS — L600 Ingrowing nail: Secondary | ICD-10-CM

## 2023-03-21 NOTE — Progress Notes (Signed)
   Chief Complaint  Patient presents with   Ingrown Toenail    Nail check     Subjective: 57 y.o. female presents today status post permanent nail avulsion procedure of the right great toe that was performed on 02/25/2023 by Dr. Annamary Rummage.  Patient doing well.  She adhered to the post care instructions.  No new complaints.   Past Medical History:  Diagnosis Date   Anemia    Anxiety    Back pain    Blood dyscrasia    GERD (gastroesophageal reflux disease)    tums after certain foods   Headache    occ   Hypothyroidism    PVC (premature ventricular contraction) no meds    increase pulse occasionally    SVT (supraventricular tachycardia)    UC (ulcerative colitis) (HCC)     Objective: Neurovascular status intact.  Skin is warm, dry and supple. Nail and respective nail fold appears to be healing appropriately.   Assessment: #1 s/p partial permanent nail matrixectomy right great toenail   Plan of care: #1 patient was evaluated  #2 light debridement of the periungual debris was performed to the border of the respective toe and nail plate using a tissue nipper. #3 patient is to return to clinic on a PRN basis.   Felecia Shelling, DPM [facility]  Dr. Felecia Shelling, DPM    [address] [place], [place] [address]                Office [phone]  Fax [phone]

## 2023-05-28 ENCOUNTER — Other Ambulatory Visit: Payer: Self-pay | Admitting: Obstetrics and Gynecology

## 2023-05-28 DIAGNOSIS — E2839 Other primary ovarian failure: Secondary | ICD-10-CM

## 2023-05-28 DIAGNOSIS — Z780 Asymptomatic menopausal state: Secondary | ICD-10-CM

## 2023-07-05 ENCOUNTER — Other Ambulatory Visit: Payer: Self-pay | Admitting: *Deleted

## 2023-07-05 DIAGNOSIS — I872 Venous insufficiency (chronic) (peripheral): Secondary | ICD-10-CM

## 2023-07-08 ENCOUNTER — Telehealth: Payer: Self-pay

## 2023-07-08 NOTE — Telephone Encounter (Signed)
   Pre-operative Risk Assessment    Patient Name: Alexandra Lee  DOB: 06-02-1966 MRN: [id number]  Last ov:10/28/2021 Upcoming visit: Unknown        Request for Surgical Clearance    Procedure:   Colonoscopy   Date of Surgery:  Clearance 07/22/23                                 Surgeon:  Dr. Iva Lento Group or Practice Name:  [facility], [place]  Phone number:  [phone] Fax number:  [phone]   Type of Clearance Requested:   - Medical    Type of Anesthesia:   Propofol   Additional requests/questions:    [name]   07/08/2023, 3:54 PM

## 2023-07-09 NOTE — Telephone Encounter (Signed)
S/w the pt and she tells me she is going to push her procedure out until like lat Dec 2024 or early Jan 2025. Pt asked to make appt 08/16/23. I assured the pt that I will send notes to surgeon's office as well.

## 2023-07-09 NOTE — Telephone Encounter (Signed)
Patient is returning call.  °

## 2023-07-09 NOTE — Telephone Encounter (Signed)
   Name: Tiernan Millikin  DOB: 1966/04/12  MRN: [id number]  Primary Cardiologist: Rollene Rotunda, MD  Chart reviewed as part of pre-operative protocol coverage. Because of Hermelinda Meuth's past medical history and time since last visit, she will require a follow-up in-office visit in order to better assess preoperative cardiovascular risk.  Pre-op covering staff: - Please schedule appointment and call patient to inform them. If patient already had an upcoming appointment within acceptable timeframe, please add "pre-op clearance" to the appointment notes so provider is aware. - Please contact requesting surgeon's office via preferred method (i.e, phone, fax) to inform them of need for appointment prior to surgery.   Carlos Levering, NP  07/09/2023, 11:26 AM

## 2023-07-09 NOTE — Telephone Encounter (Signed)
Tried calling patient to schedule in office visit for pre-op clearance no answer left a vm to call back... SN

## 2023-07-12 NOTE — Progress Notes (Unsigned)
[facility] [place]  ASSESSMENT / PLAN: Alexandra Lee is a 57 y.o. female with chronic venous insufficiency of left lower extremity causing swelling (C3 disease).  Venous duplex is significant for reflux about the left greater saphenous vein from saphenofemoral junction to calf. Recommend compression and elevation for symptomatic relief. Follow up in three months to discuss saphenous vein ablation.  CHIEF COMPLAINT: left leg painful varicosities  HISTORY OF PRESENT ILLNESS: Alexandra Lee is a 57 y.o. female referred to clinic for evaluation of these varicosities.  The patient reports varicosities been progressively worsening since the birth of her children.  Recently the varicosities have grown larger, and are very uncomfortable.  She reports bulging, and discomfort about the varicosities in her left leg.  She reports no other venous symptoms: She has never had any ulceration, she does not have significant swelling in her leg, she has no dyspigmentation.  07/13/23: Patient returns to clinic for evaluation.  The patient did not follow-up for possible endovenous ablation when I saw her last in 2022.  The patient is now interested in a venous ablation.  She reports she has been compliant with compression therapy.  Past Medical History:  Diagnosis Date   Anemia    Anxiety    Back pain    Blood dyscrasia    GERD (gastroesophageal reflux disease)    tums after certain foods   Headache    occ   Hypothyroidism    PVC (premature ventricular contraction) no meds    increase pulse occasionally    SVT (supraventricular tachycardia) (HCC)    UC (ulcerative colitis) (HCC)     Past Surgical History:  Procedure Laterality Date   BACK SURGERY     CESAREAN SECTION     x 2   COLONOSCOPY     multiple   DILATATION & CURETTAGE/HYSTEROSCOPY WITH MYOSURE N/A 02/26/2017   Procedure: DILATATION & CURETTAGE/HYSTEROSCOPY WITH MYOSURE;  Surgeon: Olivia Mackie, MD;  Location: [facility];  Service: Gynecology;  Laterality: N/A;   DILATION AND CURETTAGE OF UTERUS     2007   RECONSTRUCTION OF NOSE     UPPER GI ENDOSCOPY      Family History  Problem Relation Age of Onset   Atrial fibrillation Mother    Pulmonary embolism Father    GER disease Sister    Other Sister        brain tumor   Hypothyroidism Sister    Hypothyroidism Sister     Social History   Socioeconomic History   Marital status: Married    Spouse name: Not on file   Number of children: Not on file   Years of education: Not on file   Highest education level: Not on file  Occupational History   Not on file  Tobacco Use   Smoking status: Never   Smokeless tobacco: Never  Vaping Use   Vaping status: Never Used  Substance and Sexual Activity   Alcohol use: No   Drug use: No   Sexual activity: Not Currently  Other Topics Concern   Not on file  Social History Narrative   Lives at home with husband and two sons with autism.     Social Determinants of Health   Financial Resource Strain: Not on file  Food Insecurity: Low Risk  (11/17/2022)   [facility]   Hunger Vital Sign    Within the past 12 months, you worried that your food would run out before you got  money to buy more: Not on file    Ran Out of Food in the Last Year: Never true  Transportation Needs: No Transportation Needs (11/17/2022)   [facility]   Transportation    In the past 12 months, has lack of reliable transportation kept you from medical appointments, meetings, work or from getting things needed for daily living? : No  Physical Activity: Not on file  Stress: Not on file  Social Connections: Not on file  Intimate Partner Violence: Not on file    Allergies  Allergen Reactions   Doxycycline Diarrhea and Nausea And Vomiting   Flexeril [Cyclobenzaprine]     Hypotension    Ibuprofen Other (See Comments)    interacts with ulcerative colitis medications.    Latex Other (See Comments)    Skin reaction with prolonged contact (i.e. Bandages/sutures)   Tape Rash    Current Outpatient Medications  Medication Sig Dispense Refill   Ca Phosphate-Cholecalciferol (CALTRATE GUMMY BITES) 250-10 MG-MCG CHEW Chew 1 tablet by mouth every other day.     docusate sodium (COLACE) 100 MG capsule Take 1 capsule (100 mg total) by mouth 2 (two) times daily. 10 capsule 0   levothyroxine (SYNTHROID, LEVOTHROID) 50 MCG tablet Take 50 mcg by mouth daily before breakfast.  0   LORazepam (ATIVAN) 1 MG tablet Take 1 mg by mouth at bedtime.     Mesalamine (ASACOL) 400 MG CPDR DR capsule Take 400-800 mg by mouth See admin instructions. Take 800 mg in the morning and 400 mg in the evening, may take and additional 400-800 mg if needed.     mesalamine (CANASA) 1000 MG suppository Place 1,000 mg rectally at bedtime.     Multiple Vitamin (MULTIVITAMIN WITH MINERALS) TABS tablet Take 1 tablet by mouth daily.     polyethylene glycol (MIRALAX / GLYCOLAX) 17 g packet Take 17 g by mouth daily.     propranolol (INDERAL) 10 MG tablet Take 5 mg by mouth daily.     acetaminophen (TYLENOL) 325 MG tablet Take 650 mg by mouth every 6 (six) hours as needed for moderate pain. (Patient not taking: Reported on 07/13/2023)     bisacodyl 5 MG EC tablet Take 5 mg by mouth daily as needed for moderate constipation. (Patient not taking: Reported on 07/13/2023)     cyclobenzaprine (FLEXERIL) 10 MG tablet Take 1 tablet (10 mg total) by mouth 3 (three) times daily as needed for muscle spasms. (Patient not taking: Reported on 07/13/2023) 30 tablet 0   meclizine (ANTIVERT) 12.5 MG tablet Take 1 tablet (12.5 mg total) by mouth 3 (three) times daily as needed for dizziness. (Patient not taking: Reported on 07/13/2023) 30 tablet 0   Phenazopyridine HCl (AZO TABS PO) Take 1 tablet by mouth daily. (Patient not taking: Reported on 07/13/2023)     No current facility-administered medications for this visit.     PHYSICAL EXAM Vitals:   07/13/23 1600  BP: 99/66  Pulse: 84  Resp: 18  Temp: 98.4 F (36.9 C)  TempSrc: Temporal  SpO2: 99%  Weight: 148 lb 8 oz (67.4 kg)  Height: 5\' 5"  (1.651 m)     Constitutional: well appearing. no distress. Appears well nourished.  Neurologic: CN intac. no focal findings. no sensory loss. Psychiatric:  Mood and affect symmetric and appropriate. Eyes:  No icterus. No conjunctival pallor. Ears, nose, throat:  mucous membranes moist. Midline trachea.  Cardiac: regular rate and rhythm.  Respiratory:  unlabored. Abdominal:  soft, non-tender,  non-distended.  Peripheral vascular: 2+ DP bilaterally. Large varicosities bilaterally (L>R) in distribution of great saphenous vein. Extremity: no edema. no cyanosis. no pallor.  Skin: no gangrene. no ulceration.  Lymphatic: no Stemmer's sign. no palpable lymphadenopathy.  PERTINENT LABORATORY AND RADIOLOGIC DATA  Most recent CBC    Latest Ref Rng & Units 06/23/2022    4:20 PM 02/26/2017    7:52 AM 01/11/2017   12:33 AM  CBC  WBC 4.0 - 10.5 K/uL 6.6  8.0  8.4   Hemoglobin 12.0 - 15.0 g/dL 16.1  09.6  04.5   Hematocrit 36.0 - 46.0 % 41.8  41.3  39.2   Platelets 150 - 400 K/uL 262  361  154      Most recent CMP    Latest Ref Rng & Units 06/23/2022    4:20 PM 02/26/2017    7:52 AM  CMP  Glucose 70 - 99 mg/dL 409  811   BUN 6 - 20 mg/dL 12  14   Creatinine 9.14 - 1.00 mg/dL 7.82  9.56   Sodium 213 - 145 mmol/L 139  136   Potassium 3.5 - 5.1 mmol/L 4.3  3.5   Chloride 98 - 111 mmol/L 102  103   CO2 22 - 32 mmol/L 27  23   Calcium 8.9 - 10.3 mg/dL 08.6  9.2    Left:  - No evidence of deep vein thrombosis seen in the left lower extremity,  from the common femoral through the popliteal veins.  - No evidence of superficial venous thrombosis in the left lower  extremity.    - Venous reflux is noted in the left common femoral vein.  - Venous reflux is noted in the left sapheno-femoral junction.  - Venous  reflux is noted in the left greater saphenous vein in the thigh.  - Venous reflux is noted in the left greater saphenous vein in the calf.  - Venous reflux is noted in the left femoral vein.  - Venous reflux is noted in the left short saphenous vein.   Rande Brunt. Lenell Antu, MD [facility] Phone Number: [phone] 07/13/2023 4:57 PM  Total time spent on preparing this encounter including chart review, data review, collecting history, examining the patient, coordinating care for this established patient, 40 minutes  Portions of this report may have been transcribed using voice recognition software.  Every effort has been made to ensure accuracy; however, inadvertent computerized transcription errors may still be present.

## 2023-07-13 ENCOUNTER — Encounter: Payer: Self-pay | Admitting: Vascular Surgery

## 2023-07-13 ENCOUNTER — Ambulatory Visit (INDEPENDENT_AMBULATORY_CARE_PROVIDER_SITE_OTHER): Payer: 59 | Admitting: Vascular Surgery

## 2023-07-13 ENCOUNTER — Ambulatory Visit (HOSPITAL_COMMUNITY)
Admission: RE | Admit: 2023-07-13 | Discharge: 2023-07-13 | Disposition: A | Discharge status: Home / Self Care | Payer: 59 | Source: Ambulatory Visit | Attending: Vascular Surgery | Admitting: Vascular Surgery

## 2023-07-13 VITALS — BP 99/66 | HR 84 | Temp 98.4°F | Resp 18 | Ht 65.0 in | Wt 148.5 lb

## 2023-07-13 DIAGNOSIS — I872 Venous insufficiency (chronic) (peripheral): Secondary | ICD-10-CM

## 2023-07-13 DIAGNOSIS — I8393 Asymptomatic varicose veins of bilateral lower extremities: Secondary | ICD-10-CM

## 2023-08-09 NOTE — Progress Notes (Unsigned)
Cardiology Office Note:    Date:  08/09/2023   ID:  Alexandra Lee, DOB 1965/11/04, MRN [id number]  PCP:  Georgann Housekeeper, MD   [facility] Providers Cardiologist:  Rollene Rotunda, MD { Click to update primary MD,subspecialty MD or APP then REFRESH:1}    Referring MD: Georgann Housekeeper, MD   No chief complaint on file. ***  History of Present Illness:    Alexandra Lee is a 57 y.o. female with a hx of palpitations.  She establish care with cardiology when she moved to [facility] from [place].  Due to worsening palpitations and severe chest discomfort, she underwent repeat heart monitor that revealed paroxysmal SVT.  Her beta-blocker was increased to 25 mg Toprol with as needed propranolol.  Chest pain felt noncardiac and no ischemic evaluation was planned.  She has not been seen by cardiology since 10/28/2021.  She underwent successful L4-5 PLIF with neurosurgery 06/2022 for spondylolisthesis of the lumbar spine region.   She presents for preoperative risk evaluation prior to colonoscopy.    Palpitations Paroxysmal SVT - 25 mg Toprol is not listed on her home medications - She is currently taking 5 mg propranolol daily?   Preoperative risk evaluation She has no known CAD, no prior MI or PCI, no history of stroke.  She has a low (0.4%) risk of cardiac complications for a low risk procedure.  No further workup is needed from a cardiovascular standpoint at this time.  She may proceed with colonoscopy.     Past Medical History:  Diagnosis Date   Anemia    Anxiety    Back pain    Blood dyscrasia    GERD (gastroesophageal reflux disease)    tums after certain foods   Headache    occ   Hypothyroidism    PVC (premature ventricular contraction) no meds    increase pulse occasionally    SVT (supraventricular tachycardia) (HCC)    UC (ulcerative colitis) (HCC)     Past Surgical History:  Procedure Laterality Date   BACK SURGERY     CESAREAN SECTION     x 2    COLONOSCOPY     multiple   DILATATION & CURETTAGE/HYSTEROSCOPY WITH MYOSURE N/A 02/26/2017   Procedure: DILATATION & CURETTAGE/HYSTEROSCOPY WITH MYOSURE;  Surgeon: Olivia Mackie, MD;  Location: [facility];  Service: Gynecology;  Laterality: N/A;   DILATION AND CURETTAGE OF UTERUS     2007   RECONSTRUCTION OF NOSE     UPPER GI ENDOSCOPY      Current Medications: No outpatient medications have been marked as taking for the 08/16/23 encounter (Appointment) with Marcelino Duster, PA.     Allergies:   Doxycycline, Flexeril [cyclobenzaprine], Ibuprofen, Latex, and Tape   Social History   Socioeconomic History   Marital status: Married    Spouse name: Not on file   Number of children: Not on file   Years of education: Not on file   Highest education level: Not on file  Occupational History   Not on file  Tobacco Use   Smoking status: Never   Smokeless tobacco: Never  Vaping Use   Vaping status: Never Used  Substance and Sexual Activity   Alcohol use: No   Drug use: No   Sexual activity: Not Currently  Other Topics Concern   Not on file  Social History Narrative   Lives at home with husband and two sons with autism.     Social Determinants of Health   Financial Resource Strain: Not  on file  Food Insecurity: Low Risk  (11/17/2022)   [facility]   Hunger Vital Sign    Within the past 12 months, you worried that your food would run out before you got money to buy more: Not on file    Ran Out of Food in the Last Year: Never true  Transportation Needs: No Transportation Needs (11/17/2022)   [facility]   Transportation    In the past 12 months, has lack of reliable transportation kept you from medical appointments, meetings, work or from getting things needed for daily living? : No  Physical Activity: Not on file  Stress: Not on file  Social Connections: Not on file     Family History: The patient's ***family  history includes Atrial fibrillation in her mother; GER disease in her sister; Hypothyroidism in her sister and sister; Other in her sister; Pulmonary embolism in her father.  ROS:   Please see the history of present illness.    *** All other systems reviewed and are negative.  EKGs/Labs/Other Studies Reviewed:    The following studies were reviewed today: ***      Recent Labs: No results found for requested labs within last 365 days.  Recent Lipid Panel No results found for: "CHOL", "TRIG", "HDL", "CHOLHDL", "VLDL", "LDLCALC", "LDLDIRECT"   Risk Assessment/Calculations:   {Does this patient have ATRIAL FIBRILLATION?:[phone]}  No BP recorded.  {Refresh Note OR Click here to enter BP  :1}***         Physical Exam:    VS:  LMP 02/13/2017 (Exact Date)     Wt Readings from Last 3 Encounters:  07/13/23 148 lb 8 oz (67.4 kg)  06/23/22 152 lb (68.9 kg)  10/28/21 152 lb 3.2 oz (69 kg)     GEN: *** Well nourished, well developed in no acute distress HEENT: Normal NECK: No JVD; No carotid bruits LYMPHATICS: No lymphadenopathy CARDIAC: ***RRR, no murmurs, rubs, gallops RESPIRATORY:  Clear to auscultation without rales, wheezing or rhonchi  ABDOMEN: Soft, non-tender, non-distended MUSCULOSKELETAL:  No edema; No deformity  SKIN: Warm and dry NEUROLOGIC:  Alert and oriented x 3 PSYCHIATRIC:  Normal affect   ASSESSMENT:    No diagnosis found. PLAN:    In order of problems listed above:  ***      {Are you ordering a CV Procedure (e.g. stress test, cath, DCCV, TEE, etc)?   Press F2        :[id number]}    Medication Adjustments/Labs and Tests Ordered: Current medicines are reviewed at length with the patient today.  Concerns regarding medicines are outlined above.  No orders of the defined types were placed in this encounter.  No orders of the defined types were placed in this encounter.   There are no Patient Instructions on file for this visit.    Signed, Marcelino Duster, [place]  08/09/2023 3:35 PM    [facility]

## 2023-08-16 ENCOUNTER — Ambulatory Visit: Payer: 59 | Admitting: Physician Assistant

## 2023-09-08 NOTE — Progress Notes (Deleted)
   Cardiology Office Note    Date:  09/08/2023  ID:  Alexandra Lee, DOB 01-Dec-1965, MRN [id number] PCP:  Ransom Other, MD  Cardiologist:  Lynwood Schilling, MD  Electrophysiologist:  None   Chief Complaint: ***  History of Present Illness: .      Labwork independently reviewed:   ROS: .   *** denies chest pain, shortness of breath, lower extremity edema, fatigue, palpitations, melena, hematuria, hemoptysis, diaphoresis, weakness, presyncope, syncope, orthopnea, and PND.  All other systems are reviewed and otherwise negative.  Studies Reviewed: SABRA    EKG:  EKG is ordered today, personally reviewed, demonstrating ***     CV Studies:  Cardiac Studies & Procedures        MONITORS  LONG TERM MONITOR (3-14 DAYS) 11/12/2021  Narrative Normal sinus rhythm Two episodes of SVT with the longest being 11 beats. No sustained arrhythmias.             Current Reported Medications:.    No outpatient medications have been marked as taking for the 09/10/23 encounter (Appointment) with Zykeria Laguardia D, NP.    Physical Exam:    VS:  LMP 02/13/2017 (Exact Date)    Wt Readings from Last 3 Encounters:  07/13/23 148 lb 8 oz (67.4 kg)  06/23/22 152 lb (68.9 kg)  10/28/21 152 lb 3.2 oz (69 kg)    GEN: Well nourished, well developed in no acute distress NECK: No JVD; No carotid bruits CARDIAC: ***RRR, no murmurs, rubs, gallops RESPIRATORY:  Clear to auscultation without rales, wheezing or rhonchi  ABDOMEN: Soft, non-tender, non-distended EXTREMITIES:  No edema; No acute deformity   Asessement and Plan:.     ***     Disposition: F/u with ***  Signed, Madelynne Lasker D Kilian Schwartz, NP

## 2023-09-10 ENCOUNTER — Ambulatory Visit: Payer: 59 | Admitting: Cardiology

## 2023-09-10 DIAGNOSIS — Z01810 Encounter for preprocedural cardiovascular examination: Secondary | ICD-10-CM

## 2023-09-10 DIAGNOSIS — R002 Palpitations: Secondary | ICD-10-CM

## 2023-09-10 DIAGNOSIS — I493 Ventricular premature depolarization: Secondary | ICD-10-CM

## 2023-10-10 NOTE — Progress Notes (Signed)
 Cardiology Office Note    Date:  10/18/2023  ID:  Alexandra Lee, Alexandra Lee [birth year], MRN [id number] PCP:  Jearldine Mina, MD  Cardiologist:  Eilleen Grates, MD  Electrophysiologist:  None   Chief Complaint: Preoperative cardiac evaluation   History of Present Illness: .   Alexandra Lee is a 58 y.o. female with visit-pertinent history of palpitations.    She established care with cardiology when she moved to [place] from [place]  in 2019.  Due to worsening palpitations and severe chest discomfort, she underwent repeat heart monitor that revealed paroxysmal SVT in 2023.  Her beta-blocker was increased to 25 mg Toprol  with as needed propranolol .  Chest pain felt noncardiac and no ischemic evaluation was planned.  She has not been seen by cardiology since 10/28/2021.  She underwent successful L4-5 PLIF with neurosurgery 06/2022 for spondylolisthesis of the lumbar spine region.   Today [name] presents for preoperative risk evaluation prior to colonoscopy.  She reports that she is doing very well.  She denies any concerns or complaints today.  She reports that her palpitations are very well-controlled.  She takes propranolol  5 mg daily with additional 5 mg if needed.  She notes that increased doses of propranolol  and her previous metoprolol  caused low blood pressures.  She reports that she currently walks 3 miles a day, she tolerates this very well.  She denies chest pain or shortness of the breath with exertion.  She notes an occasional chest discomfort that she feels is musculoskeletal in nature as it improves with palpating or rubbing of the area.  ROS: .   Today she denies shortness of breath, lower extremity edema, fatigue, palpitations, melena, hematuria, hemoptysis, diaphoresis, weakness, presyncope, syncope, orthopnea, and PND.  All other systems are reviewed and otherwise negative. Studies Reviewed: Aaron Aas    EKG:  EKG is ordered today, personally reviewed, demonstrating  EKG  Interpretation Date/Time:  Monday October 18 2023 15:20:02 EST Ventricular Rate:  84 PR Interval:  180 QRS Duration:  82 QT Interval:  352 QTC Calculation: 415 R Axis:   51  Text Interpretation: Normal sinus rhythm Possible Left atrial enlargement Low voltage QRS When compared with ECG of 05-Oct-2022 12:51, No significant change was found Confirmed by Staphany Ditton [phone]) on 10/18/2023 3:26:30 PM   CV Studies:  Cardiac Studies & Procedures        MONITORS  LONG TERM MONITOR (3-14 DAYS) 11/12/2021  Narrative Normal sinus rhythm Two episodes of SVT with the longest being 11 beats. No sustained arrhythmias.            Current Reported Medications:.    Current Meds  Medication Sig   bisacodyl  5 MG EC tablet Take 5 mg by mouth daily as needed for moderate constipation.   Ca Phosphate-Cholecalciferol (CALTRATE GUMMY BITES) 250-10 MG-MCG CHEW Chew 1 tablet by mouth every other day.   levothyroxine  (SYNTHROID , LEVOTHROID) 50 MCG tablet Take 50 mcg by mouth daily before breakfast.   LORazepam  (ATIVAN ) 1 MG tablet Take 1 mg by mouth at bedtime.   meclizine  (ANTIVERT ) 12.5 MG tablet Take 1 tablet (12.5 mg total) by mouth 3 (three) times daily as needed for dizziness.   Mesalamine  (ASACOL ) 400 MG CPDR DR capsule Take 400-800 mg by mouth See admin instructions. Take 800 mg in the morning and 400 mg in the evening, may take and additional 400-800 mg if needed.   mesalamine  (CANASA ) 1000 MG suppository Place 1,000 mg rectally at bedtime.   Multiple Vitamin (MULTIVITAMIN WITH MINERALS) TABS  tablet Take 1 tablet by mouth daily.   polyethylene glycol (MIRALAX  / GLYCOLAX ) 17 g packet Take 17 g by mouth daily.   propranolol  (INDERAL ) 10 MG tablet Take 5 mg by mouth daily.   Physical Exam:    VS:  BP 126/68 (BP Location: Left Arm, Patient Position: Sitting, Cuff Size: Normal)   Pulse 84   Ht 5\' 5"  (1.651 m)   Wt 157 lb 11.2 oz (71.5 kg)   LMP 02/13/2017 (Exact Date)   SpO2 99%   BMI 26.24  kg/m    Wt Readings from Last 3 Encounters:  10/18/23 157 lb 11.2 oz (71.5 kg)  07/13/23 148 lb 8 oz (67.4 kg)  06/23/22 152 lb (68.9 kg)    GEN: Well nourished, well developed in no acute distress NECK: No JVD; No carotid bruits CARDIAC: RRR, no murmurs, rubs, gallops RESPIRATORY:  Clear to auscultation without rales, wheezing or rhonchi  ABDOMEN: Soft, non-tender, non-distended EXTREMITIES:  No edema; No acute deformity   Asessement and Plan:.    Palpitations/Paroxysmal SVT: Patient with history of palpitations and paroxysmal SVT.  She was previously on metoprolol  however was unable to tolerate given low blood pressures.  Her PCP previously started her on propranolol  5 mg daily, she tolerates this very well.  She denies any significant palpitations.  She denies chest pain, shortness of breath, lower extreme edema, orthopnea or PND.  She will continue propranolol  5 mg daily.  Encouraged patient to notify office if she has recurrent or uncontrolled palpitations.  Preoperative risk evaluation: Colonoscopy with Dr. Nickey Barn.  Patient has no known history of CAD, no prior MI or PCI and no history of stroke.  Patient has low risk of cardiac complications and is to undergo a low risk procedure.  She is able to achieve greater than 4 METS of activity. According to ACC/AHA guidelines, no further cardiovascular testing needed.  The patient may proceed to surgery at acceptable risk.     Disposition: F/u with Dr. Lavonne Prairie or Makenzie Weisner, NP as needed.   Signed, Ilyaas Musto D Windsor Zirkelbach, NP

## 2023-10-18 ENCOUNTER — Ambulatory Visit: Payer: 59 | Admitting: Surgery

## 2023-10-18 ENCOUNTER — Encounter: Payer: Self-pay | Admitting: Cardiology

## 2023-10-18 ENCOUNTER — Ambulatory Visit: Payer: 59 | Attending: Cardiology | Admitting: Cardiology

## 2023-10-18 VITALS — BP 126/68 | HR 84 | Ht 65.0 in | Wt 157.7 lb

## 2023-10-18 DIAGNOSIS — I493 Ventricular premature depolarization: Secondary | ICD-10-CM | POA: Diagnosis not present

## 2023-10-18 DIAGNOSIS — Z136 Encounter for screening for cardiovascular disorders: Secondary | ICD-10-CM

## 2023-10-18 DIAGNOSIS — Z01810 Encounter for preprocedural cardiovascular examination: Secondary | ICD-10-CM | POA: Diagnosis not present

## 2023-10-18 DIAGNOSIS — R002 Palpitations: Secondary | ICD-10-CM

## 2023-10-18 NOTE — Patient Instructions (Signed)
 Medication Instructions:  No changes *If you need a refill on your cardiac medications before your next appointment, please call your pharmacy*  Lab Work: No labs If you have labs (blood work) drawn today and your tests are completely normal, you will receive your results only by: MyChart Message (if you have MyChart) OR A paper copy in the mail If you have any lab test that is abnormal or we need to change your treatment, we will call you to review the results.  Testing/Procedures: No testing  Follow-Up: At [facility], you and your health needs are our priority.  As part of our continuing mission to provide you with exceptional heart care, we have created designated Provider Care Teams.  These Care Teams include your primary Cardiologist (physician) and Advanced Practice Providers (APPs -  Physician Assistants and Nurse Practitioners) who all work together to provide you with the care you need, when you need it.  We recommend signing up for the patient portal called "MyChart".  Sign up information is provided on this After Visit Summary.  MyChart is used to connect with patients for Virtual Visits (Telemedicine).  Patients are able to view lab/test results, encounter notes, upcoming appointments, etc.  Non-urgent messages can be sent to your provider as well.   To learn more about what you can do with MyChart, go to [email or web address].    Your next appointment:   As needed  Provider:   Eilleen Grates, MD

## 2023-12-14 ENCOUNTER — Ambulatory Visit
Admission: RE | Admit: 2023-12-14 | Discharge: 2023-12-14 | Disposition: A | Discharge status: Home / Self Care | Payer: 59 | Source: Ambulatory Visit | Attending: Obstetrics and Gynecology | Admitting: Obstetrics and Gynecology

## 2023-12-14 DIAGNOSIS — Z780 Asymptomatic menopausal state: Secondary | ICD-10-CM

## 2023-12-14 DIAGNOSIS — E2839 Other primary ovarian failure: Secondary | ICD-10-CM

## 2024-01-10 ENCOUNTER — Ambulatory Visit: Payer: 59 | Admitting: Surgery

## 2024-04-17 ENCOUNTER — Ambulatory Visit: Admitting: Surgery

## 2024-05-03 DIAGNOSIS — I8393 Asymptomatic varicose veins of bilateral lower extremities: Secondary | ICD-10-CM

## 2024-07-24 ENCOUNTER — Ambulatory Visit: Admitting: Surgery

## 2024-11-06 ENCOUNTER — Ambulatory Visit: Admitting: Surgery
